# Patient Record
Sex: Female | Born: 1950 | Race: White | Hispanic: No | Marital: Married | Smoking: Never smoker
Health system: Southern US, Community
[De-identification: ages and names within clinical notes are randomized; demographics above are authoritative.]

## PROBLEM LIST (undated history)

## (undated) DIAGNOSIS — E559 Vitamin D deficiency, unspecified: Secondary | ICD-10-CM

## (undated) DIAGNOSIS — F332 Major depressive disorder, recurrent severe without psychotic features: Secondary | ICD-10-CM

## (undated) DIAGNOSIS — Z9884 Bariatric surgery status: Secondary | ICD-10-CM

## (undated) DIAGNOSIS — E538 Deficiency of other specified B group vitamins: Secondary | ICD-10-CM

## (undated) DIAGNOSIS — R5382 Chronic fatigue, unspecified: Secondary | ICD-10-CM

## (undated) DIAGNOSIS — G4700 Insomnia, unspecified: Secondary | ICD-10-CM

## (undated) DIAGNOSIS — E039 Hypothyroidism, unspecified: Secondary | ICD-10-CM

## (undated) DIAGNOSIS — F419 Anxiety disorder, unspecified: Secondary | ICD-10-CM

## (undated) DIAGNOSIS — R55 Syncope and collapse: Principal | ICD-10-CM

## (undated) DIAGNOSIS — F410 Panic disorder [episodic paroxysmal anxiety] without agoraphobia: Secondary | ICD-10-CM

## (undated) DIAGNOSIS — E669 Obesity, unspecified: Secondary | ICD-10-CM

## (undated) HISTORY — DX: Hypothyroidism, unspecified: E03.9

## (undated) HISTORY — DX: Syncope and collapse: R55

## (undated) HISTORY — PX: CHOLECYSTECTOMY: SHX55

## (undated) HISTORY — PX: BREAST SURGERY: SHX581

## (undated) HISTORY — PX: LAMINOTOMY / EXCISION DISK POSTERIOR CERVICAL SPINE: SUR749

## (undated) HISTORY — DX: Major depressive disorder, recurrent severe without psychotic features: F33.2

## (undated) HISTORY — DX: Deficiency of other specified B group vitamins: E53.8

## (undated) HISTORY — DX: Bariatric surgery status: Z98.84

## (undated) HISTORY — DX: Obesity, unspecified: E66.9

## (undated) HISTORY — DX: Insomnia, unspecified: G47.00

## (undated) HISTORY — DX: Vitamin D deficiency, unspecified: E55.9

## (undated) HISTORY — DX: Anxiety disorder, unspecified: F41.9

## (undated) HISTORY — DX: Chronic fatigue, unspecified: R53.82

## (undated) HISTORY — PX: TUBAL LIGATION: SHX77

## (undated) HISTORY — DX: Panic disorder (episodic paroxysmal anxiety): F41.0

---

## 2000-03-22 HISTORY — PX: GASTRIC BYPASS: SHX52

## 2011-07-14 DIAGNOSIS — F339 Major depressive disorder, recurrent, unspecified: Secondary | ICD-10-CM | POA: Diagnosis not present

## 2011-11-08 ENCOUNTER — Ambulatory Visit (INDEPENDENT_AMBULATORY_CARE_PROVIDER_SITE_OTHER): Payer: Medicare Other | Admitting: Family Medicine

## 2011-11-08 ENCOUNTER — Encounter: Payer: Self-pay | Admitting: Family Medicine

## 2011-11-08 VITALS — BP 110/68 | HR 72 | Temp 98.5°F | Ht 66.5 in | Wt 214.0 lb

## 2011-11-08 DIAGNOSIS — R55 Syncope and collapse: Secondary | ICD-10-CM

## 2011-11-08 DIAGNOSIS — I951 Orthostatic hypotension: Secondary | ICD-10-CM

## 2011-11-08 DIAGNOSIS — G4700 Insomnia, unspecified: Secondary | ICD-10-CM | POA: Diagnosis not present

## 2011-11-08 DIAGNOSIS — F332 Major depressive disorder, recurrent severe without psychotic features: Secondary | ICD-10-CM

## 2011-11-08 DIAGNOSIS — E538 Deficiency of other specified B group vitamins: Secondary | ICD-10-CM | POA: Diagnosis not present

## 2011-11-08 DIAGNOSIS — Z79899 Other long term (current) drug therapy: Secondary | ICD-10-CM | POA: Diagnosis not present

## 2011-11-08 DIAGNOSIS — R5381 Other malaise: Secondary | ICD-10-CM

## 2011-11-08 DIAGNOSIS — E559 Vitamin D deficiency, unspecified: Secondary | ICD-10-CM

## 2011-11-08 DIAGNOSIS — Z9884 Bariatric surgery status: Secondary | ICD-10-CM

## 2011-11-08 DIAGNOSIS — F410 Panic disorder [episodic paroxysmal anxiety] without agoraphobia: Secondary | ICD-10-CM | POA: Diagnosis not present

## 2011-11-08 DIAGNOSIS — E039 Hypothyroidism, unspecified: Secondary | ICD-10-CM | POA: Diagnosis not present

## 2011-11-08 DIAGNOSIS — R5383 Other fatigue: Secondary | ICD-10-CM

## 2011-11-08 DIAGNOSIS — G9332 Myalgic encephalomyelitis/chronic fatigue syndrome: Secondary | ICD-10-CM

## 2011-11-08 DIAGNOSIS — D649 Anemia, unspecified: Secondary | ICD-10-CM

## 2011-11-08 DIAGNOSIS — R5382 Chronic fatigue, unspecified: Secondary | ICD-10-CM

## 2011-11-08 LAB — CBC WITH DIFFERENTIAL/PLATELET
Basophils Relative: 0.9 % (ref 0.0–3.0)
HCT: 32.6 % — ABNORMAL LOW (ref 36.0–46.0)
Hemoglobin: 10.6 g/dL — ABNORMAL LOW (ref 12.0–15.0)
Lymphocytes Relative: 31.9 % (ref 12.0–46.0)
Lymphs Abs: 1.8 10*3/uL (ref 0.7–4.0)
Monocytes Relative: 5.4 % (ref 3.0–12.0)
Neutro Abs: 3.2 10*3/uL (ref 1.4–7.7)
RBC: 3.96 Mil/uL (ref 3.87–5.11)

## 2011-11-08 LAB — T3, FREE: T3, Free: 2.5 pg/mL (ref 2.3–4.2)

## 2011-11-08 LAB — TSH: TSH: 0.11 u[IU]/mL — ABNORMAL LOW (ref 0.35–5.50)

## 2011-11-08 LAB — HEPATIC FUNCTION PANEL
ALT: 14 U/L (ref 0–35)
AST: 18 U/L (ref 0–37)
Albumin: 3.9 g/dL (ref 3.5–5.2)
Alkaline Phosphatase: 78 U/L (ref 39–117)
Total Protein: 6.9 g/dL (ref 6.0–8.3)

## 2011-11-08 LAB — VITAMIN B12: Vitamin B-12: 236 pg/mL (ref 211–911)
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
## 2011-11-09 ENCOUNTER — Encounter: Payer: Self-pay | Admitting: Family Medicine

## 2011-11-09 DIAGNOSIS — E538 Deficiency of other specified B group vitamins: Secondary | ICD-10-CM | POA: Insufficient documentation

## 2011-11-09 DIAGNOSIS — F332 Major depressive disorder, recurrent severe without psychotic features: Secondary | ICD-10-CM

## 2011-11-09 DIAGNOSIS — E039 Hypothyroidism, unspecified: Secondary | ICD-10-CM

## 2011-11-09 DIAGNOSIS — Z9884 Bariatric surgery status: Secondary | ICD-10-CM

## 2011-11-09 DIAGNOSIS — G4700 Insomnia, unspecified: Secondary | ICD-10-CM

## 2011-11-09 DIAGNOSIS — G9332 Myalgic encephalomyelitis/chronic fatigue syndrome: Secondary | ICD-10-CM

## 2011-11-09 DIAGNOSIS — R5383 Other fatigue: Secondary | ICD-10-CM | POA: Insufficient documentation

## 2011-11-09 DIAGNOSIS — F5104 Psychophysiologic insomnia: Secondary | ICD-10-CM | POA: Insufficient documentation

## 2011-11-09 DIAGNOSIS — R5382 Chronic fatigue, unspecified: Secondary | ICD-10-CM

## 2011-11-09 DIAGNOSIS — F410 Panic disorder [episodic paroxysmal anxiety] without agoraphobia: Secondary | ICD-10-CM

## 2011-11-09 DIAGNOSIS — R55 Syncope and collapse: Secondary | ICD-10-CM

## 2011-11-09 DIAGNOSIS — E559 Vitamin D deficiency, unspecified: Secondary | ICD-10-CM | POA: Insufficient documentation

## 2011-11-09 HISTORY — DX: Panic disorder (episodic paroxysmal anxiety): F41.0

## 2011-11-09 HISTORY — DX: Vitamin D deficiency, unspecified: E55.9

## 2011-11-09 HISTORY — DX: Syncope and collapse: R55

## 2011-11-09 HISTORY — DX: Major depressive disorder, recurrent severe without psychotic features: F33.2

## 2011-11-09 HISTORY — DX: Hypothyroidism, unspecified: E03.9

## 2011-11-09 HISTORY — DX: Chronic fatigue, unspecified: R53.82

## 2011-11-09 HISTORY — DX: Bariatric surgery status: Z98.84

## 2011-11-09 HISTORY — DX: Myalgic encephalomyelitis/chronic fatigue syndrome: G93.32

## 2011-11-09 HISTORY — DX: Insomnia, unspecified: G47.00

## 2011-11-09 HISTORY — DX: Deficiency of other specified B group vitamins: E53.8

## 2011-11-09 LAB — VITAMIN D 25 HYDROXY (VIT D DEFICIENCY, FRACTURES): Vit D, 25-Hydroxy: 21 ng/mL — ABNORMAL LOW (ref 30–89)

## 2011-11-09 LAB — FERRITIN: Ferritin: 4.5 ng/mL — ABNORMAL LOW (ref 10.0–291.0)

## 2011-11-18 ENCOUNTER — Other Ambulatory Visit: Payer: Self-pay | Admitting: *Deleted

## 2011-11-18 MED ORDER — ERGOCALCIFEROL 1.25 MG (50000 UT) PO CAPS: 50000.0000 [IU] | ORAL_CAPSULE | ORAL | Status: DC

## 2011-11-18 MED ORDER — LEVOTHYROXINE SODIUM 137 MCG PO CAPS: ORAL_CAPSULE | ORAL | Status: DC

## 2011-11-18 NOTE — Telephone Encounter (Signed)
Advised patient of lab results, meds sent to pharmacy as instructed.

## 2011-11-23 ENCOUNTER — Other Ambulatory Visit: Payer: Self-pay

## 2011-11-23 MED ORDER — ERGOCALCIFEROL 1.25 MG (50000 UT) PO CAPS: 50000.0000 [IU] | ORAL_CAPSULE | ORAL | Status: DC

## 2011-11-23 MED ORDER — LEVOTHYROXINE SODIUM 137 MCG PO CAPS: ORAL_CAPSULE | ORAL | Status: DC

## 2011-11-23 NOTE — Telephone Encounter (Signed)
Pt went to pick up Vit D and Synthroid and med was not at [facility]; rx was printed rather than sent electronically on 11/18/11. Med sent to [facility];pt notified while on phone.

## 2012-01-10 ENCOUNTER — Other Ambulatory Visit: Payer: Self-pay | Admitting: Family Medicine

## 2012-01-10 NOTE — Telephone Encounter (Signed)
Refilled   Hannah Beat, MD 01/10/2012, 5:22 PM

## 2012-01-10 NOTE — Telephone Encounter (Signed)
Received refill request electronically.  Patient has an appointment scheduled 01/24/12.  Patient is due for blood work. Is it okay to refill medication?

## 2012-01-24 ENCOUNTER — Ambulatory Visit (INDEPENDENT_AMBULATORY_CARE_PROVIDER_SITE_OTHER): Payer: Medicare Other | Admitting: Family Medicine

## 2012-01-24 ENCOUNTER — Encounter: Payer: Self-pay | Admitting: Family Medicine

## 2012-01-24 VITALS — BP 96/62 | HR 66 | Temp 98.7°F | Wt 213.0 lb

## 2012-01-24 DIAGNOSIS — E785 Hyperlipidemia, unspecified: Secondary | ICD-10-CM

## 2012-01-24 DIAGNOSIS — E039 Hypothyroidism, unspecified: Secondary | ICD-10-CM

## 2012-01-24 DIAGNOSIS — D509 Iron deficiency anemia, unspecified: Secondary | ICD-10-CM | POA: Insufficient documentation

## 2012-01-24 DIAGNOSIS — Z13220 Encounter for screening for lipoid disorders: Secondary | ICD-10-CM | POA: Diagnosis not present

## 2012-01-24 LAB — LIPID PANEL
HDL: 62.1 mg/dL (ref >39.00)
LDL Cholesterol: 97 mg/dL (ref 0–99)
Total CHOL/HDL Ratio: 3
Triglycerides: 75 mg/dL (ref 0.0–149.0)

## 2012-01-24 LAB — CBC WITH DIFFERENTIAL/PLATELET
Basophils Absolute: 0 10*3/uL (ref 0.0–0.1)
HCT: 38.3 % (ref 36.0–46.0)
Lymphs Abs: 1.3 10*3/uL (ref 0.7–4.0)
Monocytes Relative: 5.7 % (ref 3.0–12.0)
Platelets: 275 10*3/uL (ref 150.0–400.0)
RDW: 16.4 % — ABNORMAL HIGH (ref 11.5–14.6)

## 2012-01-24 NOTE — Patient Instructions (Addendum)
F/u 6 months for a medicare CPX  [facility] in [employer] in [place]

## 2012-01-24 NOTE — Progress Notes (Signed)
Nature conservation officer at [facility] [address] [place] [address] Phone: [phone] Fax: [phone]  Date:  01/24/2012   Name:  BRITTNYE JOSEPHS   DOB:  Nov 10, 1950   MRN:  [id number] Gender: female Age: 61 y.o.  PCP:  Hannah Beat, MD  Evaluating MD: Hannah Beat, MD   Chief Complaint: Follow-up   History of Present Illness:  Jody White is a 61 y.o. pleasant patient who presents with the following:  Has been fasting, has not been feeling well since Saturday. Will feel very tired after has some lightheadedness. Overall, feeling much better.   CBC:    Component Value Date/Time   WBC 5.5 11/08/2011 1455   HGB 10.6* 11/08/2011 1455   HCT 32.6* 11/08/2011 1455   PLT 311.0 11/08/2011 1455   MCV 82.3 11/08/2011 1455   NEUTROABS 3.2 11/08/2011 1455   LYMPHSABS 1.8 11/08/2011 1455   MONOABS 0.3 11/08/2011 1455   EOSABS 0.2 11/08/2011 1455   BASOSABS 0.0 11/08/2011 1455    Iron/TIBC/Ferritin    Component Value Date/Time   IRON 37* 11/08/2011 1455   FERRITIN 4.5* 11/08/2011 1455   Iron def anemia, feeling better  Thyroid: No symptoms. Labs reviewed. Denies cold / heat intolerance, dry skin, hair loss. No goiter.  Lab Results  Component Value Date   TSH 0.11* 11/08/2011     Patient Active Problem List  Diagnosis  . Vasovagal syncope  . B12 deficiency  . Vitamin d deficiency  . Chronic fatigue syndrome  . H/O gastric bypass  . Hypothyroid  . Panic disorder without agoraphobia  . Persistent disorder of initiating or maintaining sleep  . Major depressive disorder, recurrent episode, severe, without mention of psychotic behavior  . Iron deficiency anemia    Past Medical History  Diagnosis Date  . 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 B12 deficiency 11/09/2011  . Vitamin D deficiency 11/09/2011  . Persistent disorder of initiating or maintaining sleep 11/09/2011  . Panic disorder  without agoraphobia 11/09/2011  . Hypothyroid 11/09/2011  . H/O gastric bypass 11/09/2011  . Chronic fatigue syndrome 11/09/2011  . Vasovagal syncope 11/09/2011    Multiple prior admissions, [facility] [place] in [place]. Confirmed with Tilt Table testing in [place]     Past Surgical History  Procedure Date  . Gastric bypass 2002    History  Substance Use Topics  . Smoking status: Never Smoker   . Smokeless tobacco: Never Used  . Alcohol Use: Yes     Comment: wine occassionally    Family History  Problem Relation Age of Onset  . Alcohol abuse Father     No Known Allergies  Medication list has been reviewed and updated.  Outpatient Prescriptions Prior to Visit  Medication Sig Dispense Refill  . ALPRAZolam (XANAX) 0.5 MG tablet Take 0.5 mg by mouth 3 (three) times daily as needed.      . dicyclomine (BENTYL) 20 MG tablet Take 1 -2 by mouth daily as needed      . escitalopram (LEXAPRO) 20 MG tablet Take 20 mg by mouth daily.      . Levothyroxine Sodium 137 MCG CAPS Take one by mouth daily  30 capsule  2  . Vitamin D, Ergocalciferol, (DRISDOL) 50000 UNITS CAPS TAKE 1 CAPSULE (50,000 UNITS TOTAL) BY MOUTH ONCE A WEEK.  4 capsule  3  . zolpidem (AMBIEN CR) 12.5 MG CR tablet Take 12.5 mg by mouth at bedtime.  Last reviewed on 01/24/2012  8:06 AM by Loma Messing, RN  Review of Systems:  Some lightheadedness over weekend, no cp, no sob  Physical Examination: Filed Vitals:   01/24/12 0801  BP: 96/62  Pulse: 66  Temp: 98.7 F (37.1 C)  TempSrc: Oral  Weight: 213 lb (96.616 kg)  SpO2: 96%    There is no height on file to calculate BMI. Ideal Body Weight:     GEN: WDWN, NAD, Non-toxic, A & O x 3 HEENT: Atraumatic, Normocephalic. Neck supple. No masses, No LAD. Ears and Nose: No external deformity. CV: RRR, No M/G/R. No JVD. No thrill. No extra heart sounds. PULM: CTA B, no wheezes, crackles, rhonchi. No retractions. No resp. distress. No accessory  muscle use. EXTR: No c/c/e NEURO Normal gait.  PSYCH: Normally interactive. Conversant. Not depressed or anxious appearing.  Calm demeanor.    Assessment and Plan:  1. Iron deficiency anemia  CBC with Differential  2. Hypothyroid  TSH  3. Screening for lipoid disorders  Lipid panel  4. Other and unspecified hyperlipidemia  Lipid panel   Recheck levels today, feeling better  Orders Today:  Orders Placed This Encounter  Procedures  . TSH  . Lipid panel  . CBC with Differential    Updated Medication List: (Includes new medications, updates to list, dose adjustments) Meds ordered this encounter  Medications  . ferrous sulfate 325 (65 FE) MG tablet    Sig: Take 325 mg by mouth 2 (two) times daily with a meal.    Medications Discontinued: There are no discontinued medications.   Hannah Beat, MD

## 2012-01-26 MED ORDER — LEVOTHYROXINE SODIUM 125 MCG PO TABS: 125.0000 ug | ORAL_TABLET | Freq: Every day | ORAL | Status: DC

## 2012-01-26 NOTE — Addendum Note (Signed)
Addended by: Hannah Beat on: 01/26/2012 01:52 PM   Modules accepted: Orders

## 2012-02-25 ENCOUNTER — Other Ambulatory Visit: Payer: Self-pay

## 2012-02-25 NOTE — Telephone Encounter (Signed)
pts husband left note requesting refill on bentyl to [facility].Please advise.

## 2012-02-26 MED ORDER — DICYCLOMINE HCL 20 MG PO TABS: ORAL_TABLET | ORAL | Status: DC

## 2012-02-26 NOTE — Telephone Encounter (Signed)
done

## 2012-07-19 ENCOUNTER — Ambulatory Visit (INDEPENDENT_AMBULATORY_CARE_PROVIDER_SITE_OTHER): Payer: BC Managed Care – PPO | Admitting: Family Medicine

## 2012-07-19 ENCOUNTER — Encounter: Payer: Self-pay | Admitting: Family Medicine

## 2012-07-19 VITALS — BP 120/64 | HR 56 | Temp 98.1°F | Ht 66.5 in | Wt 214.5 lb

## 2012-07-19 DIAGNOSIS — E559 Vitamin D deficiency, unspecified: Secondary | ICD-10-CM | POA: Diagnosis not present

## 2012-07-19 DIAGNOSIS — E538 Deficiency of other specified B group vitamins: Secondary | ICD-10-CM

## 2012-07-19 DIAGNOSIS — R5381 Other malaise: Secondary | ICD-10-CM

## 2012-07-19 DIAGNOSIS — Z0000 Encounter for general adult medical examination without abnormal findings: Secondary | ICD-10-CM

## 2012-07-19 DIAGNOSIS — R5383 Other fatigue: Secondary | ICD-10-CM

## 2012-07-19 DIAGNOSIS — E039 Hypothyroidism, unspecified: Secondary | ICD-10-CM

## 2012-07-19 DIAGNOSIS — D509 Iron deficiency anemia, unspecified: Secondary | ICD-10-CM | POA: Diagnosis not present

## 2012-07-19 LAB — TSH: TSH: 1.91 u[IU]/mL (ref 0.35–5.50)

## 2012-07-19 LAB — CBC WITH DIFFERENTIAL/PLATELET
Basophils Absolute: 0 10*3/uL (ref 0.0–0.1)
Hemoglobin: 12.7 g/dL (ref 12.0–15.0)
Lymphocytes Relative: 37.4 % (ref 12.0–46.0)
Monocytes Relative: 6 % (ref 3.0–12.0)
Neutro Abs: 2.1 10*3/uL (ref 1.4–7.7)
Neutrophils Relative %: 53.3 % (ref 43.0–77.0)
Platelets: 256 10*3/uL (ref 150.0–400.0)
RDW: 13.3 % (ref 11.5–14.6)
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
## 2012-07-20 LAB — VITAMIN D 25 HYDROXY (VIT D DEFICIENCY, FRACTURES): Vit D, 25-Hydroxy: 20 ng/mL — ABNORMAL LOW (ref 30–89)

## 2012-07-21 ENCOUNTER — Telehealth: Payer: Self-pay | Admitting: *Deleted

## 2012-07-21 MED ORDER — VITAMIN D (ERGOCALCIFEROL) 1.25 MG (50000 UNIT) PO CAPS: ORAL_CAPSULE | ORAL | Status: DC

## 2012-07-21 NOTE — Telephone Encounter (Signed)
Medication sent to pharamcy

## 2012-07-24 ENCOUNTER — Other Ambulatory Visit: Payer: Self-pay | Admitting: Family Medicine

## 2012-07-24 NOTE — Telephone Encounter (Signed)
Pt request status of name brand synthroid 125 mcg to [facility]; [facility] to Durenda Age that was printed instead of going electronically.pt notified refill given to [facility]. [facility] for [facility].

## 2012-11-22 ENCOUNTER — Other Ambulatory Visit: Payer: Self-pay | Admitting: Family Medicine

## 2012-11-22 MED ORDER — LEVOTHYROXINE SODIUM 125 MCG PO TABS: ORAL_TABLET | ORAL | Status: DC

## 2013-01-20 ENCOUNTER — Other Ambulatory Visit: Payer: Self-pay | Admitting: Family Medicine

## 2013-01-21 NOTE — Telephone Encounter (Signed)
Last office visit 07/19/2012. Do you want to refill Vit D?

## 2013-07-20 ENCOUNTER — Other Ambulatory Visit: Payer: Self-pay | Admitting: Family Medicine

## 2013-08-12 ENCOUNTER — Other Ambulatory Visit: Payer: Self-pay | Admitting: Family Medicine

## 2013-08-14 NOTE — Telephone Encounter (Signed)
Last office visit 07/19/2012.  Last TSH 07/19/2012.  Ok to refill?

## 2013-08-15 NOTE — Telephone Encounter (Signed)
Ok to refill 30, 3 refills  F/u CPX in the next few months

## 2013-09-03 ENCOUNTER — Encounter: Payer: Self-pay | Admitting: Family Medicine

## 2013-09-03 ENCOUNTER — Ambulatory Visit (INDEPENDENT_AMBULATORY_CARE_PROVIDER_SITE_OTHER): Payer: Medicare Other | Admitting: Family Medicine

## 2013-09-03 VITALS — BP 110/74 | HR 68 | Temp 98.1°F | Resp 18 | Wt 216.0 lb

## 2013-09-03 DIAGNOSIS — E039 Hypothyroidism, unspecified: Secondary | ICD-10-CM

## 2013-09-03 DIAGNOSIS — G9332 Myalgic encephalomyelitis/chronic fatigue syndrome: Secondary | ICD-10-CM | POA: Diagnosis not present

## 2013-09-03 DIAGNOSIS — R5381 Other malaise: Secondary | ICD-10-CM

## 2013-09-03 DIAGNOSIS — Z79899 Other long term (current) drug therapy: Secondary | ICD-10-CM

## 2013-09-03 DIAGNOSIS — D509 Iron deficiency anemia, unspecified: Secondary | ICD-10-CM | POA: Diagnosis not present

## 2013-09-03 DIAGNOSIS — F332 Major depressive disorder, recurrent severe without psychotic features: Secondary | ICD-10-CM | POA: Diagnosis not present

## 2013-09-03 DIAGNOSIS — F410 Panic disorder [episodic paroxysmal anxiety] without agoraphobia: Secondary | ICD-10-CM | POA: Diagnosis not present

## 2013-09-03 DIAGNOSIS — R5383 Other fatigue: Secondary | ICD-10-CM | POA: Diagnosis not present

## 2013-09-03 DIAGNOSIS — R5382 Chronic fatigue, unspecified: Secondary | ICD-10-CM | POA: Diagnosis not present

## 2013-09-03 DIAGNOSIS — R55 Syncope and collapse: Secondary | ICD-10-CM

## 2013-09-03 LAB — COMPREHENSIVE METABOLIC PANEL
ALK PHOS: 77 U/L (ref 39–117)
ALT: 22 U/L (ref 0–35)
AST: 23 U/L (ref 0–37)
Albumin: 4.2 g/dL (ref 3.5–5.2)
BILIRUBIN TOTAL: 1.1 mg/dL (ref 0.2–1.2)
BUN: 13 mg/dL (ref 6–23)
CO2: 30 meq/L (ref 19–32)
Calcium: 9.7 mg/dL (ref 8.4–10.5)
Chloride: 104 mEq/L (ref 96–112)
Creatinine, Ser: 0.7 mg/dL (ref 0.4–1.2)
GFR: 87.81 mL/min (ref >60.00)
GLUCOSE: 112 mg/dL — AB (ref 70–99)
Potassium: 4.6 mEq/L (ref 3.5–5.1)
SODIUM: 140 meq/L (ref 135–145)
TOTAL PROTEIN: 6.8 g/dL (ref 6.0–8.3)

## 2013-09-03 LAB — CBC WITH DIFFERENTIAL/PLATELET
BASOS PCT: 0.6 % (ref 0.0–3.0)
Basophils Absolute: 0 10*3/uL (ref 0.0–0.1)
EOS PCT: 1.9 % (ref 0.0–5.0)
Eosinophils Absolute: 0.1 10*3/uL (ref 0.0–0.7)
HEMATOCRIT: 38.2 % (ref 36.0–46.0)
Hemoglobin: 12.8 g/dL (ref 12.0–15.0)
LYMPHS ABS: 1.7 10*3/uL (ref 0.7–4.0)
Lymphocytes Relative: 34.1 % (ref 12.0–46.0)
MCHC: 33.5 g/dL (ref 30.0–36.0)
MCV: 87.9 fl (ref 78.0–100.0)
MONO ABS: 0.3 10*3/uL (ref 0.1–1.0)
MONOS PCT: 5.2 % (ref 3.0–12.0)
NEUTROS PCT: 58.2 % (ref 43.0–77.0)
Neutro Abs: 2.9 10*3/uL (ref 1.4–7.7)
PLATELETS: 321 10*3/uL (ref 150.0–400.0)
RBC: 4.35 Mil/uL (ref 3.87–5.11)
RDW: 13.4 % (ref 11.5–15.5)
WBC: 5 10*3/uL (ref 4.0–10.5)

## 2013-09-03 LAB — TSH: TSH: 1.73 u[IU]/mL (ref 0.35–4.50)

## 2013-09-03 LAB — IBC PANEL
IRON: 70 ug/dL (ref 42–145)
SATURATION RATIOS: 15.6 % — AB (ref 20.0–50.0)
Transferrin: 320.9 mg/dL (ref 212.0–360.0)

## 2013-09-03 NOTE — Progress Notes (Signed)
[address] [place] [address] Phone: [phone] Fax: [phone]  Patient ID: Jody OtaYvelisse M Brenes MRN: [id number], DOB: 1950/07/02, 63 y.o. Date of Encounter: 09/03/2013  Primary Physician:  Hannah BeatSpencer Liyana Suniga, MD   Chief Complaint: No chief complaint on file.   Subjective:   History of Present Illness:  Jody White is a 63 y.o. very pleasant female patient who presents with the following:  She is here followup for essentially laboratories, and she also has mother healthcare questions in general.  Followup hypothyroidism, currently on 125 mcg and doing well.  Anxiety and depression, the patient stopped her Lexapro, she is still seeing her psychiatrist, and she is currently now only taking Xanax as needed.  She also takes some Ambien CR as needed at bedtime.  She also does have a history of anemia in the past, and wanted to have her blood count and iron levels checked.  She is also status post gastric bypass and she has chronic fatigue syndrome.  She has had a tubal ligation, and I discussed potential health maintenance including Pap smears, mammograms, colonoscopies, and she declined. She stated that "she believes and the Resurrection, and does not fear death."  Past Medical History, Surgical History, Social History, Family History, Problem List, Medications, and Allergies have been reviewed and updated if relevant.  Review of Systems:  GEN: No acute illnesses, no fevers, chills. GI: No n/v/d, eating normally Pulm: No SOB Interactive and getting along well at home.  Otherwise, ROS is as per the HPI.  Objective:   Physical Examination: Filed Vitals:   09/03/13 1434  BP: 110/74  Pulse: 68  Temp: 98.1 F (36.7 C)  Resp: 18  Weight: 216 lb (97.977 kg)    GEN: WDWN, NAD, Non-toxic, A & O x 3 HEENT: Atraumatic, Normocephalic. Neck supple. No masses, No LAD. Ears and Nose: No external deformity. CV: RRR, No M/G/R. No JVD. No thrill. No extra heart  sounds. PULM: CTA B, no wheezes, crackles, rhonchi. No retractions. No resp. distress. No accessory muscle use. EXTR: No c/c/e NEURO Normal gait.  PSYCH: Normally interactive. Conversant. Not depressed or anxious appearing.  Calm demeanor.   Laboratory and Imaging Data:  Assessment & Plan:   Unspecified hypothyroidism - Plan: Comprehensive metabolic panel, CBC with Differential, TSH, IBC panel  Other malaise and fatigue - Plan: Comprehensive metabolic panel, CBC with Differential, TSH, IBC panel  Encounter for long-term (current) use of other medications - Plan: Comprehensive metabolic panel, CBC with Differential, TSH, IBC panel  Chronic fatigue syndrome  Hypothyroid  Major depressive disorder, recurrent episode, severe, without mention of psychotic behavior  Iron deficiency anemia  Panic disorder without agoraphobia  Vasovagal syncope  Check basic laboratories. Hopefully she will do okay off of all of her psychiatric medication.  Will refill and alter anything as needed based on her laboratories, and refill her thyroid medication based on her TSH  New Prescriptions   No medications on file   Modified Medications   No medications on file   Orders Placed This Encounter  Procedures  . Comprehensive metabolic panel  . CBC with Differential  . TSH  . IBC panel   Follow-up: No Follow-up on file. Unless noted above, the patient is to follow-up if symptoms worsen. Red flags were reviewed with the patient.  Signed,  Elpidio GaleaSpencer T. Maudry Zeidan, MD, CAQ Sports Medicine   Discontinued Medications   DICYCLOMINE (BENTYL) 20 MG TABLET    TAKE 1 -2 BY MOUTH DAILY AS NEEDED  ESCITALOPRAM (LEXAPRO) 20 MG TABLET    Take 20 mg by mouth daily.   Current Medications at Discharge:   Medication List       This list is accurate as of: 09/03/13  2:39 PM.  Always use your most recent med list.               ALPRAZolam 0.5 MG tablet  Commonly known as:  XANAX  Take 0.5 mg by mouth  3 (three) times daily as needed.     ferrous sulfate 325 (65 FE) MG tablet  Take 325 mg by mouth 2 (two) times daily with a meal.     SYNTHROID 125 MCG tablet  Generic drug:  levothyroxine  TAKE ONE TABLET BY MOUTH DAILY     Vitamin D (Ergocalciferol) 50000 UNITS Caps capsule  Commonly known as:  DRISDOL  TAKE 1 CAPSULE (50,000 UNITS TOTAL) BY MOUTH ONCE A WEEK.     zolpidem 12.5 MG CR tablet  Commonly known as:  AMBIEN CR  Take 12.5 mg by mouth at bedtime.

## 2013-09-06 ENCOUNTER — Encounter: Payer: Self-pay | Admitting: *Deleted

## 2013-09-06 MED ORDER — SYNTHROID 125 MCG PO TABS: ORAL_TABLET | ORAL | Status: DC

## 2013-09-06 NOTE — Addendum Note (Signed)
Addended by: Hannah BeatOPLAND, SPENCER on: 09/06/2013 10:32 AM   Modules accepted: Orders

## 2013-09-27 ENCOUNTER — Telehealth: Payer: Self-pay | Admitting: Family Medicine

## 2013-09-27 NOTE — Telephone Encounter (Signed)
Patient Information:  Caller Name: Jody White  Phone: [phone]  Patient: Jody White, Jody White  Gender: Female  DOB: 1950-04-30  Age: 63 Years  PCP: Hannah Beatopland, Spencer (Family Practice)  Office Follow Up:  Does the office need to follow up with this patient?: No  Instructions For The Office: N/A   Symptoms  Reason For Call & Symptoms: Pt reports having left eye redness and puffiness.  Reviewed Health History In EMR: Yes  Reviewed Medications In EMR: Yes  Reviewed Allergies In EMR: Yes  Reviewed Surgeries / Procedures: Yes  Date of Onset of Symptoms: 09/26/2013  Guideline(s) Used:  Eye - Red Without Pus  Disposition Per Guideline:   Home Care  Reason For Disposition Reached:   Red eye and no complications  Advice Given:  N/A  Patient Will Follow Care Advice:  YES

## 2014-05-31 ENCOUNTER — Telehealth: Payer: Self-pay | Admitting: Family Medicine

## 2014-05-31 MED ORDER — SYNTHROID 125 MCG PO TABS: ORAL_TABLET | ORAL | Status: DC

## 2014-05-31 NOTE — Telephone Encounter (Signed)
Okay with me, set up  CPX  After 07/20/2014 with fasting labs prior

## 2014-05-31 NOTE — Telephone Encounter (Signed)
Jody White came in office with her spouse today. She would like switch from Dr. Patsy Lageropland to Dr. Ermalene SearingBedsole b/c she is more comfortable speaking to a female about female issues. Will this switch be ok?  Jody White also needs refill on synthroid. [facility] [place]. Please advise

## 2014-05-31 NOTE — Telephone Encounter (Signed)
I am Ok with that. She is very nice. I think that her daughter is Dr. Senaida LangeB's patient. Whole family is nice.   Lupita LeashDonna, can you help refill 3 month supply of synthroid.

## 2014-06-03 NOTE — Telephone Encounter (Signed)
LVM for pt to c/b and schedule cpe after 07/20/14 with Dr. Ermalene SearingBedsole.  PCP changed

## 2014-09-02 ENCOUNTER — Encounter: Payer: Self-pay | Admitting: *Deleted

## 2014-09-18 ENCOUNTER — Telehealth: Payer: Self-pay | Admitting: Family Medicine

## 2014-09-18 NOTE — Telephone Encounter (Signed)
PLEASE NOTE: All timestamps contained within this report are represented as [place] Standard Time. CONFIDENTIALTY NOTICE: This fax transmission is intended only for the addressee. It contains information that is legally privileged, confidential or otherwise protected from use or disclosure. If you are not the intended recipient, you are strictly prohibited from reviewing, disclosing, copying using or disseminating any of this information or taking any action in reliance on or regarding this information. If you have received this fax in error, please notify us immediately by telephone so that we can arrange for its return to [place]. Phone: [phone], Toll-Free: [phone], Fax: [phone] Page: 1 of 2 Call Id: [id number] [facility] Day - Client TELEPHONE ADVICE RECORD [facility] Patient Name: Jody White Gender: Female DOB: 24-Jan-1951 Age: 64 Y 9 D Return Phone Number: [phone] (Primary), [phone] (Secondary) Address[place] [address] Client [facility] Day - Client Client Site [facility] - Day Physician Copland, Karleen Hampshire Contact Type Call Call Type Triage / Clinical Relationship To Patient Self Appointment Disposition EMR Appointment Not Necessary Info pasted into Epic Yes Return Phone Number [phone] (Primary) Chief Complaint Cold Symptom Initial Comment Caller states, she has a cold, wants to see a Dr PreDisposition Call Doctor Nurse Assessment Nurse: Yetta Barre, RN, Miranda Date/Time (Eastern Time): 09/18/2014 9:23:51 AM Confirm and document reason for call. If symptomatic, describe symptoms. ---Caller states she also has nasal congestion and sore throat for the last 2 days. Temp up to 100.6 yesterday afternoon. Has the patient traveled out of the country within the last 30 days? ---No Does the patient require triage? ---Yes Related visit to physician within the  last 2 weeks? ---No Does the PT have any chronic conditions? (i.e. diabetes, asthma, etc.) ---Yes List chronic conditions. ---Thyroid Guidelines Guideline Title Affirmed Question Affirmed Notes Nurse Date/Time Lamount Cohen Time) Common Cold Cold with no complications (all triage questions negative) Yetta Barre, RN, Miranda 09/18/2014 9:26:41 AM Disp. Time Lamount Cohen Time) Disposition Final User 09/18/2014 9:32:29 AM Home Care Yes Yetta Barre, RN, Lenetta Quaker Understands: Yes Disagree/Comply: Comply Care Advice Given Per Guideline PLEASE NOTE: All timestamps contained within this report are represented as [place] Standard Time. CONFIDENTIALTY NOTICE: This fax transmission is intended only for the addressee. It contains information that is legally privileged, confidential or otherwise protected from use or disclosure. If you are not the intended recipient, you are strictly prohibited from reviewing, disclosing, copying using or disseminating any of this information or taking any action in reliance on or regarding this information. If you have received this fax in error, please notify us immediately by telephone so that we can arrange for its return to [place]. Phone: [phone], Toll-Free: [phone], Fax: [phone] Page: 2 of 2 Call Id: [id number] Care Advice Given Per Guideline REASSURANCE: HOME CARE: You should be able to treat this at home. * It sounds like an uncomplicated cold that we can treat at home. FOR A STUFFY NOSE - USE NASAL WASHES: * Introduction: Saline (salt water) nasal irrigation (nasal wash) is an effective and simple home remedy for treating stuffy nose and sinus congestion. The nose can be irrigated by pouring, spraying, or squirting salt water into the nose and then letting it run back out. * How it Helps: The salt water rinses out excess mucus, washes out any irritants (dust, allergens) that might be present, and moistens the nasal cavity. * Methods: There are several ways to  perform nasal irrigation. You can use a saline nasal spray  bottle (available over-the-counter), a rubber ear syringe, a medical syringe without the needle, or a NETI POT. MEDICINES FOR STUFFY OR RUNNY NOSE: * Most cold medicines that are available over-thecounter (OTC) are not helpful. NASAL DECONGESTANTS FOR A VERY STUFFY NOSE: * If you have a very stuffy nose, nasal decongestant medicines can shrink the swollen nasal mucosa and allow for easier breathing. If you have a very runny nose, these medicines can reduce the amount of drainage. They may be taken as pills by mouth or as a nasal spray. * Most people do NOT need to use these medicines. If your nose feels blocked, you should try using nasal washes first. TREATMENT FOR ASSOCIATED SYMPTOMS OF COLDS: * For muscle aches, headaches, or moderate fever (over 101 degrees F) (38.9 C) use acetaminophen every 4 hours. * Sore throat: throat lozenges, hard candy or warm chicken broth. * Hydrate: drink extra liquids. HUMIDIFIER: If the air in your home is dry, use a humidifier. CONTAGIOUSNESS: * The cold virus is present in your nasal secretions. * Cover your nose and mouth with a tissue when you sneeze or cough. * Wash your hands frequently with soap and water. * You can return to work or school after the fever is gone and you feel well enough to participate in normal activities. EXPECTED COURSE: * Nasal discharge 7-14 days * Fever 2-3 days * Cough 2-3 weeks. CALL BACK IF: * Fever lasts over 3 days * Runny nose lasts over 10 days * You become short of breath * You become worse. CARE ADVICE given per Colds (Adult) guideline. * Colds are caused by viruses, and no medicine or 'shot' will cure an uncomplicated cold. After Care Instructions Given Call Event Type User Date / Time Description

## 2014-09-18 NOTE — Telephone Encounter (Signed)
Patient Name: Jody White DOB: [birth year] Initial Comment Caller states, she has a cold, wants to see a Dr Nurse Assessment Nurse: Yetta BarreJones, RN, Miranda Date/Time (Eastern Time): 09/18/2014 9:23:51 AM Confirm and document reason for call. If symptomatic, describe symptoms. ---Caller states she also has nasal congestion and sore throat for the last 2 days. Temp up to 100.6 yesterday afternoon. Has the patient traveled out of the country within the last 30 days? ---No Does the patient require triage? ---Yes Related visit to physician within the last 2 weeks? ---No Does the PT have any chronic conditions? (i.e. diabetes, asthma, etc.) ---Yes List chronic conditions. ---Thyroid Guidelines Guideline Title Affirmed Question Affirmed Notes Common Cold Cold with no complications (all triage questions negative) Final Disposition User Home Care Yetta BarreJones, RN, Tamera PuntMiranda

## 2014-09-19 NOTE — Telephone Encounter (Signed)
Noted  

## 2014-09-24 ENCOUNTER — Other Ambulatory Visit: Payer: Self-pay | Admitting: Family Medicine

## 2014-09-24 NOTE — Telephone Encounter (Signed)
Please call and schedule CPE with fasting lab with Dr. Ermalene SearingBedsole.

## 2014-09-25 ENCOUNTER — Encounter: Payer: Self-pay | Admitting: Family Medicine

## 2014-09-25 NOTE — Telephone Encounter (Signed)
Called pt, left message for pt to call to schedule a CPE / lt

## 2014-09-25 NOTE — Telephone Encounter (Signed)
Mailed pt a letter, the answering machine at home is in Spanish only and wanted to be sure pt got the message that a CPE with fasting labs is needed. / lt

## 2014-10-20 ENCOUNTER — Other Ambulatory Visit: Payer: Self-pay | Admitting: Family Medicine

## 2014-10-31 ENCOUNTER — Encounter: Payer: Self-pay | Admitting: *Deleted

## 2014-10-31 ENCOUNTER — Ambulatory Visit (INDEPENDENT_AMBULATORY_CARE_PROVIDER_SITE_OTHER): Payer: Medicare Other | Admitting: Obstetrics & Gynecology

## 2014-10-31 ENCOUNTER — Encounter: Payer: Self-pay | Admitting: Family Medicine

## 2014-10-31 ENCOUNTER — Ambulatory Visit (INDEPENDENT_AMBULATORY_CARE_PROVIDER_SITE_OTHER): Payer: Medicare Other | Admitting: Family Medicine

## 2014-10-31 ENCOUNTER — Encounter: Payer: Self-pay | Admitting: Obstetrics & Gynecology

## 2014-10-31 VITALS — BP 98/60 | HR 60 | Temp 98.6°F | Wt 211.0 lb

## 2014-10-31 VITALS — BP 95/50 | HR 79 | Resp 18 | Ht 66.5 in | Wt 211.0 lb

## 2014-10-31 DIAGNOSIS — E538 Deficiency of other specified B group vitamins: Secondary | ICD-10-CM | POA: Diagnosis not present

## 2014-10-31 DIAGNOSIS — E038 Other specified hypothyroidism: Secondary | ICD-10-CM

## 2014-10-31 DIAGNOSIS — D509 Iron deficiency anemia, unspecified: Secondary | ICD-10-CM

## 2014-10-31 DIAGNOSIS — F410 Panic disorder [episodic paroxysmal anxiety] without agoraphobia: Secondary | ICD-10-CM

## 2014-10-31 DIAGNOSIS — Z01419 Encounter for gynecological examination (general) (routine) without abnormal findings: Secondary | ICD-10-CM | POA: Diagnosis not present

## 2014-10-31 DIAGNOSIS — E559 Vitamin D deficiency, unspecified: Secondary | ICD-10-CM | POA: Diagnosis not present

## 2014-10-31 DIAGNOSIS — Z0000 Encounter for general adult medical examination without abnormal findings: Secondary | ICD-10-CM

## 2014-10-31 DIAGNOSIS — B353 Tinea pedis: Secondary | ICD-10-CM | POA: Diagnosis not present

## 2014-10-31 DIAGNOSIS — Z13220 Encounter for screening for lipoid disorders: Secondary | ICD-10-CM | POA: Diagnosis not present

## 2014-10-31 DIAGNOSIS — Z79899 Other long term (current) drug therapy: Secondary | ICD-10-CM | POA: Diagnosis not present

## 2014-10-31 LAB — LIPID PANEL
CHOL/HDL RATIO: 3
Cholesterol: 197 mg/dL (ref 0–200)
HDL: 75.8 mg/dL (ref >39.00)
LDL Cholesterol: 110 mg/dL — ABNORMAL HIGH (ref 0–99)
NONHDL: 121.1
Triglycerides: 58 mg/dL (ref 0.0–149.0)
VLDL: 11.6 mg/dL (ref 0.0–40.0)

## 2014-10-31 LAB — CBC WITH DIFFERENTIAL/PLATELET
BASOS PCT: 0.7 % (ref 0.0–3.0)
Basophils Absolute: 0 10*3/uL (ref 0.0–0.1)
EOS PCT: 1.5 % (ref 0.0–5.0)
Eosinophils Absolute: 0.1 10*3/uL (ref 0.0–0.7)
HCT: 40.1 % (ref 36.0–46.0)
Hemoglobin: 13.3 g/dL (ref 12.0–15.0)
LYMPHS PCT: 35.4 % (ref 12.0–46.0)
Lymphs Abs: 1.7 10*3/uL (ref 0.7–4.0)
MCHC: 33.2 g/dL (ref 30.0–36.0)
MCV: 88.5 fl (ref 78.0–100.0)
MONOS PCT: 6.1 % (ref 3.0–12.0)
Monocytes Absolute: 0.3 10*3/uL (ref 0.1–1.0)
NEUTROS PCT: 56.3 % (ref 43.0–77.0)
Neutro Abs: 2.8 10*3/uL (ref 1.4–7.7)
PLATELETS: 319 10*3/uL (ref 150.0–400.0)
RBC: 4.53 Mil/uL (ref 3.87–5.11)
RDW: 13.3 % (ref 11.5–15.5)
WBC: 4.9 10*3/uL (ref 4.0–10.5)

## 2014-10-31 LAB — COMPREHENSIVE METABOLIC PANEL
ALT: 16 U/L (ref 0–35)
AST: 20 U/L (ref 0–37)
Albumin: 4.3 g/dL (ref 3.5–5.2)
Alkaline Phosphatase: 69 U/L (ref 39–117)
BILIRUBIN TOTAL: 1.1 mg/dL (ref 0.2–1.2)
BUN: 15 mg/dL (ref 6–23)
CO2: 31 meq/L (ref 19–32)
CREATININE: 0.74 mg/dL (ref 0.40–1.20)
Calcium: 10 mg/dL (ref 8.4–10.5)
Chloride: 105 mEq/L (ref 96–112)
GFR: 83.42 mL/min (ref >60.00)
Glucose, Bld: 113 mg/dL — ABNORMAL HIGH (ref 70–99)
Potassium: 4.8 mEq/L (ref 3.5–5.1)
SODIUM: 144 meq/L (ref 135–145)
TOTAL PROTEIN: 7.4 g/dL (ref 6.0–8.3)

## 2014-10-31 LAB — VITAMIN B12: Vitamin B-12: 261 pg/mL (ref 211–911)

## 2014-10-31 LAB — POCT SKIN KOH: Skin KOH, POC: POSITIVE

## 2014-10-31 LAB — TSH: TSH: 9.01 u[IU]/mL — AB (ref 0.35–4.50)

## 2014-10-31 LAB — VITAMIN D 25 HYDROXY (VIT D DEFICIENCY, FRACTURES): VITD: 10.41 ng/mL — ABNORMAL LOW (ref 30.00–100.00)

## 2014-10-31 MED ORDER — KETOCONAZOLE 2 % EX CREA
1.0000 | TOPICAL_CREAM | Freq: Every day | CUTANEOUS | Status: DC
Start: 2014-10-31 — End: 2014-11-14

## 2014-10-31 NOTE — Progress Notes (Signed)
Pre visit review using our clinic review tool, if applicable. No additional management support is needed unless otherwise documented below in the visit note. 

## 2014-10-31 NOTE — Assessment & Plan Note (Signed)
KOH positive.  Treat with topical. Call if not improving as expected.

## 2014-10-31 NOTE — Progress Notes (Signed)
Subjective:    Jody White is a 64 y.o. MH P3 (43, 74, and 53 yo kids, 4 grands)  female who presents for an annual exam. She moved here 3 years ago to be with her kids/grands. The patient has no complaints today. The patient is not currently sexually active for about 6 years due to her husband's ED. GYN screening history: last pap: was normal. The patient wears seatbelts: yes. The patient participates in regular exercise: no. Has the patient ever been transfused or tattooed?: no. The patient reports that there is not domestic violence in her life.   Menstrual History: OB History    No data available      Menarche age: 45  No LMP recorded. Patient is postmenopausal.    The following portions of the patient's history were reviewed and updated as appropriate: allergies, current medications, past family history, past medical history, past social history, past surgical history and problem list.  Review of Systems A comprehensive review of systems was negative.  Mammogram due. Colonoscopy in 2010 in [place]. Married for 45 years.   Objective:    BP 95/50 mmHg  Pulse 79  Resp 18  Ht 5' 6.5" (1.689 m)  Wt 211 lb (95.709 kg)  BMI 33.55 kg/m2  General Appearance:    Alert, cooperative, no distress, appears stated age  Head:    Normocephalic, without obvious abnormality, atraumatic  Eyes:    PERRL, conjunctiva/corneas clear, EOM's intact, fundi    benign, both eyes  Ears:    Normal TM's and external ear canals, both ears  Nose:   Nares normal, septum midline, mucosa normal, no drainage    or sinus tenderness  Throat:   Lips, mucosa, and tongue normal; teeth and gums normal  Neck:   Supple, symmetrical, trachea midline, no adenopathy;    thyroid:  no enlargement/tenderness/nodules; no carotid   bruit or JVD  Back:     Symmetric, no curvature, ROM normal, no CVA tenderness  Lungs:     Clear to auscultation bilaterally, respirations unlabored  Chest Wall:    No tenderness or deformity   Heart:    Regular rate and rhythm, S1 and S2 normal, no murmur, rub   or gallop  Breast Exam:    No tenderness, masses, or nipple abnormality  Abdomen:     Soft, non-tender, bowel sounds active all four quadrants,    no masses, no organomegaly  Genitalia:    Normal female without lesion, discharge or tenderness, No masses with bimanual exam, NSSA, NT     Extremities:   Extremities normal, atraumatic, no cyanosis or edema  Pulses:   2+ and symmetric all extremities  Skin:   Skin color, texture, turgor normal, no rashes or lesions  Lymph nodes:   Cervical, supraclavicular, and axillary nodes normal  Neurologic:   CNII-XII intact, normal strength, sensation and reflexes    throughout  .    Assessment:    Healthy female exam.    Plan:     Breast self exam technique reviewed and patient encouraged to perform self-exam monthly. Mammogram.

## 2014-10-31 NOTE — Assessment & Plan Note (Signed)
Due for re-eval. 

## 2014-10-31 NOTE — Progress Notes (Signed)
   Subjective:    Patient ID: Jody White, female    DOB: 27-Feb-1951, 64 y.o.   MRN: [id number]  HPI  64 year old female presetns for follow up hypothyroidism.   Sees Gyn for CPX. Last OV today 10/31/2014.   Hypothyroidism:  Due for re-eval on synthroid 125 mcg. Lab Results  Component Value Date   TSH 1.73 09/03/2013     She has also noted new onset rash  On feet. Dry flaky skin , itchy, red. She has tried antifungal OTC spray. No relief.  no toenail issue, no hand issue.  No other rash, no history of skin disorders.  Panic disorder: Previously followed by Dr. Evelene Croon. Treated with alprazolam  Using 1.5 mg tabs at bedtime for insomnia and panic. Rarely needing for  Panic attack during the day. Pt requesting that we start filling this for her monthly. She is agreeable to UDS.   Depression resolved. She had SE to Ambien in past.       Review of Systems  Constitutional: Negative for fever and fatigue.  HENT: Negative for ear pain.   Eyes: Negative for pain.  Respiratory: Negative for chest tightness and shortness of breath.   Cardiovascular: Negative for chest pain, palpitations and leg swelling.  Gastrointestinal: Negative for abdominal pain.  Genitourinary: Negative for dysuria.       Objective:   Physical Exam  Constitutional: Vital signs are normal. She appears well-developed and well-nourished. She is cooperative.  Non-toxic appearance. She does not appear ill. No distress.  HENT:  Head: Normocephalic.  Right Ear: Hearing, tympanic membrane, external ear and ear canal normal. Tympanic membrane is not erythematous, not retracted and not bulging.  Left Ear: Hearing, tympanic membrane, external ear and ear canal normal. Tympanic membrane is not erythematous, not retracted and not bulging.  Nose: No mucosal edema or rhinorrhea. Right sinus exhibits no maxillary sinus tenderness and no frontal sinus tenderness. Left sinus exhibits no maxillary sinus tenderness and no  frontal sinus tenderness.  Mouth/Throat: Uvula is midline, oropharynx is clear and moist and mucous membranes are normal.  Eyes: Conjunctivae, EOM and lids are normal. Pupils are equal, round, and reactive to light. Lids are everted and swept, no foreign bodies found.  Neck: Trachea normal and normal range of motion. Neck supple. Carotid bruit is not present. No thyroid mass and no thyromegaly present.  Cardiovascular: Normal rate, regular rhythm, S1 normal, S2 normal, normal heart sounds, intact distal pulses and normal pulses.  Exam reveals no gallop and no friction rub.   No murmur heard. Pulmonary/Chest: Effort normal and breath sounds normal. No tachypnea. No respiratory distress. She has no decreased breath sounds. She has no wheezes. She has no rhonchi. She has no rales.  Abdominal: Soft. Normal appearance and bowel sounds are normal. There is no tenderness.  Neurological: She is alert.  Skin: Skin is warm, dry and intact. No rash noted.  Flaky red patches on bilateral feet  Psychiatric: Her speech is normal and behavior is normal. Judgment and thought content normal. Her mood appears not anxious. Cognition and memory are normal. She does not exhibit a depressed mood.          Assessment & Plan:

## 2014-10-31 NOTE — Assessment & Plan Note (Signed)
UDS and contract today to be signed.  Once pt due we will fill 90 tabs monthly of alprazolam.  Pt understands risks and agrees to limit use as much as possible.

## 2014-10-31 NOTE — Patient Instructions (Addendum)
Stop at lab on way out for labs and urine drug scree and to sign controlled substance contract. Call when refill needed for antianxiety med. Will need to see you for this about every 6 months.  Use cream on feet daily x 3 weeks. Spray shoes weekly with antifungal spray.  Call if not improving as expected.

## 2014-11-01 ENCOUNTER — Other Ambulatory Visit (INDEPENDENT_AMBULATORY_CARE_PROVIDER_SITE_OTHER): Payer: Medicare Other

## 2014-11-01 DIAGNOSIS — E038 Other specified hypothyroidism: Secondary | ICD-10-CM | POA: Diagnosis not present

## 2014-11-01 DIAGNOSIS — E065 Other chronic thyroiditis: Secondary | ICD-10-CM

## 2014-11-01 LAB — T3, FREE: T3, Free: 2.5 pg/mL (ref 2.3–4.2)

## 2014-11-01 LAB — T4, FREE: Free T4: 1.04 ng/dL (ref 0.60–1.60)

## 2014-11-09 ENCOUNTER — Ambulatory Visit (INDEPENDENT_AMBULATORY_CARE_PROVIDER_SITE_OTHER): Payer: Medicare Other | Admitting: Family Medicine

## 2014-11-09 ENCOUNTER — Encounter: Payer: Self-pay | Admitting: Family Medicine

## 2014-11-09 VITALS — BP 118/64 | HR 74 | Temp 97.9°F | Ht 66.5 in | Wt 211.5 lb

## 2014-11-09 DIAGNOSIS — B353 Tinea pedis: Secondary | ICD-10-CM

## 2014-11-09 MED ORDER — FLUCONAZOLE 100 MG PO TABS: 100.0000 mg | ORAL_TABLET | Freq: Two times a day (BID) | ORAL | Status: DC

## 2014-11-09 NOTE — Progress Notes (Signed)
Pre visit review using our clinic review tool, if applicable. No additional management support is needed unless otherwise documented below in the visit note. 

## 2014-11-09 NOTE — Progress Notes (Signed)
   Subjective:    Patient ID: Jody White, female    DOB: Apr 07, 1950, 64 y.o.   MRN: [id number]  HPI Here for a rash on both feet that itches and burns. She saw Dr. Ermalene Searing a few days ago and was diagnosed with a fungal infection. Her KOH test was positive then. She thinks she picked this up at her apartment since they recently discovered fungus in the carpet from moisture around her HVAC unit. The complex crew has been repairing this. She has been applying ketoconazole cream bid but has seen no improvement.   Review of Systems  Constitutional: Negative.   Skin: Positive for rash.       Objective:   Physical Exam  Constitutional: She appears well-developed and well-nourished.  Skin:  The soles of both feet have small areas of macular erythema, no warmth or tenderness           Assessment & Plan:  Tinea pedis. We will add Diflucan 100 mg bid. Recheck prn

## 2014-11-11 ENCOUNTER — Telehealth: Payer: Self-pay

## 2014-11-11 NOTE — Telephone Encounter (Signed)
PLEASE NOTE: All timestamps contained within this report are represented as [place] Standard Time. CONFIDENTIALTY NOTICE: This fax transmission is intended only for the addressee. It contains information that is legally privileged, confidential or otherwise protected from use or disclosure. If you are not the intended recipient, you are strictly prohibited from reviewing, disclosing, copying using or disseminating any of this information or taking any action in reliance on or regarding this information. If you have received this fax in error, please notify us immediately by telephone so that we can arrange for its return to [place]. Phone: [phone], Toll-Free: [phone], Fax: [phone] Page: 1 of 2 Call Id: [id number] [facility] Night - Client TELEPHONE ADVICE RECORD [facility] Patient Name: Jody White Gender: Female DOB: 03-02-1951 Age: 64 Y 2 M Return Phone Number: [phone] (Primary), [phone] (Secondary) Address: City/State/Zip: [place] Client [facility] Night - Client Client Site [facility] - Night Physician [place], [place] Contact Type Call Call Type Triage / Clinical Relationship To Patient Self Return Phone Number [phone] (Primary) Chief Complaint Foot Pain Initial Comment Caller states has infection in skin of her feet, dr said it is a fungus and gave her meds, redness has increased and is burning, itching very bad PreDisposition Home Care Nurse Assessment Nurse: Enid Skeens, RN, Arline Asp Date/Time (Eastern Time): 11/09/2014 9:40:08 AM Confirm and document reason for call. If symptomatic, describe symptoms. ---Caller states has infection in skin of her feet, dr said it is a fungus and gave her meds, redness has increased and is burning, itching very bad. Has been about 10 days. Has the patient traveled out of the country within the last 30 days? ---No Does the patient require  triage? ---Yes Related visit to physician within the last 2 weeks? ---No Does the PT have any chronic conditions? (i.e. diabetes, asthma, etc.) ---No Guidelines Guideline Title Affirmed Question Affirmed Notes Nurse Date/Time (Eastern Time) Rash or Redness - Localized [1] Localized rash is very painful AND [2] no fever Enid Skeens, RN, Arline Asp 11/09/2014 9:44:13 AM Disp. Time Lamount Cohen Time) Disposition Final User 11/09/2014 9:48:58 AM See Physician within 24 Hours Yes Enid Skeens, RN, Ave Filter Understands: Yes Disagree/Comply: Comply Care Advice Given Per Guideline PLEASE NOTE: All timestamps contained within this report are represented as [place] Standard Time. CONFIDENTIALTY NOTICE: This fax transmission is intended only for the addressee. It contains information that is legally privileged, confidential or otherwise protected from use or disclosure. If you are not the intended recipient, you are strictly prohibited from reviewing, disclosing, copying using or disseminating any of this information or taking any action in reliance on or regarding this information. If you have received this fax in error, please notify us immediately by telephone so that we can arrange for its return to [place]. Phone: [phone], Toll-Free: [phone], Fax: [phone] Page: 2 of 2 Call Id: [id number] Care Advice Given Per Guideline SEE PHYSICIAN WITHIN 24 HOURS: * IF OFFICE WILL BE CLOSED AND NO PCP TRIAGE: You need to be seen within the next 24 hours. An urgent care center is often a good source of care if your doctor's office is closed. CALL BACK IF: * Fever occurs * You become worse. CARE ADVICE given per Rash - Localized and Cause Unknown (Adult) guideline. After Care Instructions Given Call Event Type User Date / Time Description Referrals [facility]

## 2014-11-11 NOTE — Telephone Encounter (Signed)
Pt was seen at [facility] 11/09/14.

## 2014-11-14 ENCOUNTER — Encounter: Payer: Self-pay | Admitting: Family Medicine

## 2014-11-14 ENCOUNTER — Other Ambulatory Visit: Payer: Self-pay | Admitting: Family Medicine

## 2014-11-14 NOTE — Telephone Encounter (Signed)
Last office visit 10/31/2014.  Last refilled 10/31/2014 for 30 grams.  Ok to refill?

## 2014-11-18 ENCOUNTER — Encounter: Payer: Self-pay | Admitting: *Deleted

## 2014-11-18 DIAGNOSIS — B353 Tinea pedis: Secondary | ICD-10-CM | POA: Diagnosis not present

## 2014-11-23 ENCOUNTER — Other Ambulatory Visit: Payer: Self-pay | Admitting: Family Medicine

## 2014-12-17 DIAGNOSIS — B353 Tinea pedis: Secondary | ICD-10-CM | POA: Diagnosis not present

## 2014-12-24 DIAGNOSIS — B353 Tinea pedis: Secondary | ICD-10-CM | POA: Diagnosis not present

## 2014-12-24 DIAGNOSIS — L308 Other specified dermatitis: Secondary | ICD-10-CM | POA: Diagnosis not present

## 2015-01-26 ENCOUNTER — Emergency Department
Admission: EM | Admit: 2015-01-26 | Discharge: 2015-01-26 | Disposition: A | Discharge status: Home / Self Care | Payer: Medicare Other | Attending: Emergency Medicine | Admitting: Emergency Medicine

## 2015-01-26 ENCOUNTER — Encounter: Payer: Self-pay | Admitting: Emergency Medicine

## 2015-01-26 ENCOUNTER — Emergency Department: Payer: Medicare Other

## 2015-01-26 DIAGNOSIS — R531 Weakness: Secondary | ICD-10-CM | POA: Diagnosis not present

## 2015-01-26 DIAGNOSIS — R404 Transient alteration of awareness: Secondary | ICD-10-CM | POA: Diagnosis not present

## 2015-01-26 DIAGNOSIS — R209 Unspecified disturbances of skin sensation: Secondary | ICD-10-CM | POA: Diagnosis not present

## 2015-01-26 DIAGNOSIS — R202 Paresthesia of skin: Secondary | ICD-10-CM | POA: Insufficient documentation

## 2015-01-26 DIAGNOSIS — R200 Anesthesia of skin: Secondary | ICD-10-CM | POA: Diagnosis not present

## 2015-01-26 DIAGNOSIS — Z792 Long term (current) use of antibiotics: Secondary | ICD-10-CM | POA: Insufficient documentation

## 2015-01-26 DIAGNOSIS — F419 Anxiety disorder, unspecified: Secondary | ICD-10-CM | POA: Insufficient documentation

## 2015-01-26 DIAGNOSIS — Z79899 Other long term (current) drug therapy: Secondary | ICD-10-CM | POA: Diagnosis not present

## 2015-01-26 LAB — CBC
HCT: 38.2 % (ref 35.0–47.0)
Hemoglobin: 12.9 g/dL (ref 12.0–16.0)
MCH: 29.5 pg (ref 26.0–34.0)
MCHC: 33.8 g/dL (ref 32.0–36.0)
MCV: 87.2 fL (ref 80.0–100.0)
PLATELETS: 285 10*3/uL (ref 150–440)
RBC: 4.38 MIL/uL (ref 3.80–5.20)
RDW: 13.6 % (ref 11.5–14.5)
WBC: 4 10*3/uL (ref 3.6–11.0)

## 2015-01-26 LAB — URINALYSIS COMPLETE WITH MICROSCOPIC (ARMC ONLY)
BACTERIA UA: NONE SEEN
Bilirubin Urine: NEGATIVE
GLUCOSE, UA: NEGATIVE mg/dL
Ketones, ur: NEGATIVE mg/dL
NITRITE: NEGATIVE
Protein, ur: NEGATIVE mg/dL
SPECIFIC GRAVITY, URINE: 1.009 (ref 1.005–1.030)
pH: 6 (ref 5.0–8.0)

## 2015-01-26 LAB — BASIC METABOLIC PANEL
Anion gap: 2 — ABNORMAL LOW (ref 5–15)
BUN: 10 mg/dL (ref 6–20)
CALCIUM: 9.2 mg/dL (ref 8.9–10.3)
CO2: 29 mmol/L (ref 22–32)
CREATININE: 0.65 mg/dL (ref 0.44–1.00)
Chloride: 108 mmol/L (ref 101–111)
Glucose, Bld: 151 mg/dL — ABNORMAL HIGH (ref 65–99)
Potassium: 3.8 mmol/L (ref 3.5–5.1)
SODIUM: 139 mmol/L (ref 135–145)

## 2015-01-26 LAB — GLUCOSE, CAPILLARY: GLUCOSE-CAPILLARY: 106 mg/dL — AB (ref 65–99)

## 2015-01-26 LAB — TSH: TSH: 1.54 u[IU]/mL (ref 0.350–4.500)

## 2015-01-26 NOTE — ED Notes (Signed)
Discussed discharge instructions and follow-up care with patient. No questions or concerns at this time. Pt stable at discharge.  

## 2015-01-26 NOTE — Discharge Instructions (Signed)
Panic Attacks °Panic attacks are sudden, short-lived surges of severe anxiety, fear, or discomfort. They may occur for no reason when you are relaxed, when you are anxious, or when you are sleeping. Panic attacks may occur for a number of reasons:  °· Healthy people occasionally have panic attacks in extreme, life-threatening situations, such as war or natural disasters. Normal anxiety is a protective mechanism of the body that helps us react to danger (fight or flight response). °· Panic attacks are often seen with anxiety disorders, such as panic disorder, social anxiety disorder, generalized anxiety disorder, and phobias. Anxiety disorders cause excessive or uncontrollable anxiety. They may interfere with your relationships or other life activities. °· Panic attacks are sometimes seen with other mental illnesses, such as depression and posttraumatic stress disorder. °· Certain medical conditions, prescription medicines, and drugs of abuse can cause panic attacks. °SYMPTOMS  °Panic attacks start suddenly, peak within 20 minutes, and are accompanied by four or more of the following symptoms: °· Pounding heart or fast heart rate (palpitations). °· Sweating. °· Trembling or shaking. °· Shortness of breath or feeling smothered. °· Feeling choked. °· Chest pain or discomfort. °· Nausea or strange feeling in your stomach. °· Dizziness, light-headedness, or feeling like you will faint. °· Chills or hot flushes. °· Numbness or tingling in your lips or hands and feet. °· Feeling that things are not real or feeling that you are not yourself. °· Fear of losing control or going crazy. °· Fear of dying. °Some of these symptoms can mimic serious medical conditions. For example, you may think you are having a heart attack. Although panic attacks can be very scary, they are not life threatening. °DIAGNOSIS  °Panic attacks are diagnosed through an assessment by your health care provider. Your health care provider will ask  questions about your symptoms, such as where and when they occurred. Your health care provider will also ask about your medical history and use of alcohol and drugs, including prescription medicines. Your health care provider may order blood tests or other studies to rule out a serious medical condition. Your health care provider may refer you to a mental health professional for further evaluation. °TREATMENT  °· Most healthy people who have one or two panic attacks in an extreme, life-threatening situation will not require treatment. °· The treatment for panic attacks associated with anxiety disorders or other mental illness typically involves counseling with a mental health professional, medicine, or a combination of both. Your health care provider will help determine what treatment is best for you. °· Panic attacks due to physical illness usually go away with treatment of the illness. If prescription medicine is causing panic attacks, talk with your health care provider about stopping the medicine, decreasing the dose, or substituting another medicine. °· Panic attacks due to alcohol or drug abuse go away with abstinence. Some adults need professional help in order to stop drinking or using drugs. °HOME CARE INSTRUCTIONS  °· Take all medicines as directed by your health care provider.   °· Schedule and attend follow-up visits as directed by your health care provider. It is important to keep all your appointments. °SEEK MEDICAL CARE IF: °· You are not able to take your medicines as prescribed. °· Your symptoms do not improve or get worse. °SEEK IMMEDIATE MEDICAL CARE IF:  °· You experience panic attack symptoms that are different than your usual symptoms. °· You have serious thoughts about hurting yourself or others. °· You are taking medicine for panic attacks and   have a serious side effect. °MAKE SURE YOU: °· Understand these instructions. °· Will watch your condition. °· Will get help right away if you are not  doing well or get worse. °  °This information is not intended to replace advice given to you by your health care provider. Make sure you discuss any questions you have with your health care provider. °  °Document Released: 03/08/2005 Document Revised: 03/13/2013 Document Reviewed: 10/20/2012 °Elsevier Interactive Patient Education ©2016 [employer] °Paresthesia °Paresthesia is an abnormal burning or prickling sensation. This sensation is generally felt in the hands, arms, legs, or feet. However, it may occur in any part of the body. Usually, it is not painful. The feeling may be described as: °· Tingling or numbness. °· Pins and needles. °· Skin crawling. °· Buzzing. °· Limbs falling asleep. °· Itching. °Most people experience temporary (transient) paresthesia at some time in their lives. Paresthesia may occur when you breathe too quickly (hyperventilation). It can also occur without any apparent cause. Commonly, paresthesia occurs when pressure is placed on a nerve. The sensation quickly goes away after the pressure is removed. For some people, however, paresthesia is a long-lasting (chronic) condition that is caused by an underlying disorder. If you continue to have paresthesia, you may need further medical evaluation. °HOME CARE INSTRUCTIONS °Watch your condition for any changes. Taking the following actions may help to lessen any discomfort that you are feeling: °· Avoid drinking alcohol. °· Try acupuncture or massage to help relieve your symptoms. °· Keep all follow-up visits as directed by your health care provider. This is important. °SEEK MEDICAL CARE IF: °· You continue to have episodes of paresthesia. °· Your burning or prickling feeling gets worse when you walk. °· You have pain, cramps, or dizziness. °· You develop a rash. °SEEK IMMEDIATE MEDICAL CARE IF: °· You feel weak. °· You have trouble walking or moving. °· You have problems with speech, understanding, or vision. °· You feel confused. °· You  cannot control your bladder or bowel movements. °· You have numbness after an injury. °· You faint. °  °This information is not intended to replace advice given to you by your health care provider. Make sure you discuss any questions you have with your health care provider. °  °Document Released: 02/26/2002 Document Revised: 07/23/2014 Document Reviewed: 03/04/2014 °Elsevier Interactive Patient Education ©2016 Elsevier Inc. ° °

## 2015-01-26 NOTE — ED Notes (Signed)
[facility], per patient she started having numbness all over her face that started on Thursday. Husband reports that this morning he went into the living room and patient appeared to be having a "panic attack." Husband states that the patient was pale and slightly disoriented.

## 2015-01-26 NOTE — ED Provider Notes (Signed)
[facility] Emergency Department Provider Note REMINDER - THIS NOTE IS NOT A FINAL MEDICAL RECORD UNTIL IT IS SIGNED. UNTIL THEN, THE CONTENT BELOW MAY REFLECT INFORMATION FROM A DOCUMENTATION TEMPLATE, NOT THE ACTUAL PATIENT VISIT. ____________________________________________  Time seen: Approximately 11:14 AM  I have reviewed the triage vital signs and the nursing notes.   HISTORY  Chief Complaint Weakness    HPI Jody White is a 64 y.o. female who presents for evaluation of having some tingling and slight numbing feeling over the face, arms, and lower legs for the last 3-4 days. This sensation is noted on both sides of the face, both arms especially around the hands, and feet.  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 She recently stopped her Xanax. In addition, the patient reports that she has been feeling tired and fatigued for the last several weeks. She is also noticed some slight increase in hair loss.  No headache, no chest pain. No weakness in the arms or legs. No trouble walking. No trouble speaking.  Past Medical History  Diagnosis Date  . 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 B12 deficiency 11/09/2011  . Vitamin D deficiency 11/09/2011  . Persistent disorder of initiating or maintaining sleep 11/09/2011  . Panic disorder without agoraphobia 11/09/2011  . Hypothyroid 11/09/2011  . H/O gastric bypass 11/09/2011  . Chronic fatigue syndrome 11/09/2011  . 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 Confirmed with [employer] [employer] testing [place]   . Anxiety   . Obesity     Patient Active Problem List   Diagnosis Date Noted  . Tinea pedis of both feet 10/31/2014  . Iron deficiency anemia 01/24/2012  . 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 B12 deficiency 11/09/2011  . Vitamin D  deficiency 11/09/2011  . Chronic fatigue syndrome 11/09/2011  . H/O gastric bypass 11/09/2011  . Hypothyroid 11/09/2011  . Panic disorder without agoraphobia 11/09/2011  . Persistent disorder of initiating or maintaining sleep 11/09/2011    Past Surgical History  Procedure Laterality Date  . Gastric bypass  2002  . Breast surgery    . Cholecystectomy    . Tubal ligation      Current Outpatient Rx  Name  Route  Sig  Dispense  Refill  . ALPRAZolam (XANAX) 0.5 MG tablet   Oral   Take 0.5 mg by mouth 3 (three) times daily as needed.         . Butenafine HCl (LOTRIMIN ULTRA EX)   Apply externally   Apply topically.         . fluconazole (DIFLUCAN) 100 MG tablet   Oral   Take 1 tablet (100 mg total) by mouth 2 (two) times daily.   30 tablet   0   . ketoconazole (NIZORAL) 2 % cream      APPLY 1 APPLICATION TOPICALLY DAILY.   30 g   0   . SYNTHROID 125 MCG tablet      TAKE ONE TABLET BY MOUTH DAILY   30 tablet   11     Allergies Review of patient's allergies indicates no known allergies.  Family History  Problem Relation Age of Onset  . Alcohol abuse Father     Social History Social History  Substance Use Topics  . Smoking status: Never Smoker   . Smokeless tobacco: Never Used  . Alcohol  Use: Yes     Comment: wine occassionally    Review of Systems Constitutional: No fever/chills Eyes: No visual changes. ENT: No sore throat. Cardiovascular: Denies chest pain. Respiratory: Denies shortness of breath. Gastrointestinal: No abdominal pain.  No nausea, no vomiting.  No diarrhea.  No constipation. Genitourinary: Negative for dysuria. Musculoskeletal: Negative for back pain. Skin: Negative for rash. Neurological: Negative for headaches, focal weakness. Does report numbness as noted.  10-point ROS otherwise negative.  ____________________________________________   PHYSICAL EXAM:  VITAL SIGNS: ED Triage Vitals  Enc Vitals Group     BP 01/26/15  1027 138/64 mmHg     Pulse Rate 01/26/15 1027 71     Resp 01/26/15 1027 11     Temp 01/26/15 1027 98.1 F (36.7 C)     Temp src --      SpO2 01/26/15 1027 100 %     Weight 01/26/15 1027 203 lb 12.8 oz (92.443 kg)     Height 01/26/15 1027  (1.676 m)     Head Cir --      Peak Flow --      Pain Score 01/26/15 1029 0     Pain Loc --      Pain Edu? --      Excl. in GC? --    Constitutional: Alert and oriented. Well appearing and in no acute distress. Eyes: Conjunctivae are normal. PERRL. EOMI. Head: Atraumatic. Nose: No congestion/rhinnorhea. Mouth/Throat: Mucous membranes are moist.  Oropharynx non-erythematous. Neck: No stridor.   Cardiovascular: Normal rate, regular rhythm. Grossly normal heart sounds.  Good peripheral circulation. Respiratory: Normal respiratory effort.  No retractions. Lungs CTAB. Gastrointestinal: Soft and nontender. No distention. No abdominal bruits. No CVA tenderness. Musculoskeletal: No lower extremity tenderness nor edema.  No joint effusions. Neurologic:  Normal speech and language. No gross focal neurologic deficits are appreciated.   NIH score equals 0, performed by me at bedside. The patient has no pronator drift. The patient has normal cranial nerve exam. Extraocular movements are normal. Visual fields are normal. Patient has 5 out of 5 strength in all extremities. There is no numbness or gross, acute sensory abnormality in the extremities bilaterally on objective testing and able to discriminate light touch well... Does report subjective "numbing" feeling on all extremities and face bilateral. No speech disturbance. No dysarthria. No aphasia. No ataxia. Normal finger nose finger bilat. Patient speaking in full and clear sentences.   Skin:  Skin is warm, dry and intact. No rash noted. Psychiatric: Mood and affect are normal. Speech and behavior are normal.  ____________________________________________   LABS (all labs ordered are  listed, but only abnormal results are displayed)  Labs Reviewed  URINALYSIS COMPLETEWITH MICROSCOPIC ([facility] ONLY) - Abnormal; Notable for the following:    Color, Urine YELLOW (*)    APPearance CLEAR (*)    Hgb urine dipstick 1+ (*)    Leukocytes, UA TRACE (*)    Squamous Epithelial / LPF 0-5 (*)    All other components within normal limits  BASIC METABOLIC PANEL - Abnormal; Notable for the following:    Glucose, Bld 151 (*)    Anion gap 2 (*)    All other components within normal limits  GLUCOSE, CAPILLARY - Abnormal; Notable for the following:    Glucose-Capillary 106 (*)    All other components within normal limits  URINE CULTURE  CBC  TSH  CBG MONITORING, ED   ____________________________________________  EKG  ED ECG REPORT I, Natali Lavallee, the attending  physician, personally viewed and interpreted this ECG.  Date: 01/26/2015 EKG Time: 1025 Rate: 70 Rhythm: normal sinus rhythm QRS Axis: normal Intervals: normal ST/T Wave abnormalities: normal Conduction Disutrbances: none Narrative Interpretation: unremarkable  ____________________________________________  RADIOLOGY  CT Head Wo Contrast (Final result) Result time: 01/26/15 12:40:48   Final result by Rad Results In Interface (01/26/15 12:40:48)   Narrative:   CLINICAL DATA: Diffuse facial numbness for the past 3 days.  EXAM: CT HEAD WITHOUT CONTRAST  TECHNIQUE: Contiguous axial images were obtained from the base of the skull through the vertex without intravenous contrast.  COMPARISON: None.  FINDINGS: Diffusely enlarged ventricles and subarachnoid spaces. No intracranial hemorrhage, mass lesion or CT evidence of acute infarction. Unremarkable bones and included paranasal sinuses.  IMPRESSION: No acute abnormality. Mild diffuse cerebral and cerebellar atrophy.    ____________________________________________   PROCEDURES  Procedure(s) performed: None  Critical Care performed:  No  ____________________________________________   INITIAL IMPRESSION / ASSESSMENT AND PLAN / ED COURSE  Pertinent labs & imaging results that were available during my care of the patient were reviewed by me and considered in my medical decision making (see chart for details).  Patient presents for generalized weakness without focality as well as paresthesias for the last 3-4 days. Her exam is very reassuring, she is no distress. Her hemodynamics are normal. This is slightly unclear as to an obvious cause, but I highly doubt acute stroke given the bilateral nature of her paresthesias also in the setting of her panic disorder, hyperventilation is considered. We will check electrolytes including sodium and potassium, thyroid, urinalysis. If labs are unremarkable and CT is normal I would feel competent the patient could be discharged and follow up with her Dr. Dallas Schim[place].  She is very pleasant, in no distress. Reassuring exam at this time. EKG normal, no cardiopulmonary symptoms.  ----------------------------------------- 2:39 PM on 01/26/2015 -----------------------------------------  Review the patient's lab results and CT. I find no evidence of acute abnormality. Again, though she does have bilateral paresthesias in her CT does not demonstrate any acute evidence of infarction or previous infarction, her symptoms are not typical of a stroke given the bilateral nature and association with what appears to be frequent panic attacks. As we reviewed her discharge, her husband noted that she has frequent episodes like this which have been diagnosed for years as "panic" attacks. At this point, I discussed with the patient careful return precautions and follow-up care. She'll follow-up closely with her doctor. ____________________________________________   FINAL CLINICAL IMPRESSION(S) / ED DIAGNOSES  Final diagnoses:  Anxiety  Paresthesia      Sharyn CreamerMark Kristena Wilhelmi, MD 01/26/15 1440

## 2015-01-28 ENCOUNTER — Encounter: Payer: Self-pay | Admitting: Family Medicine

## 2015-01-28 ENCOUNTER — Ambulatory Visit (INDEPENDENT_AMBULATORY_CARE_PROVIDER_SITE_OTHER): Payer: BLUE CROSS/BLUE SHIELD | Admitting: Family Medicine

## 2015-01-28 VITALS — BP 102/60 | HR 74 | Temp 98.6°F | Ht 66.5 in | Wt 202.5 lb

## 2015-01-28 DIAGNOSIS — G4700 Insomnia, unspecified: Secondary | ICD-10-CM

## 2015-01-28 DIAGNOSIS — R202 Paresthesia of skin: Secondary | ICD-10-CM | POA: Diagnosis not present

## 2015-01-28 DIAGNOSIS — F410 Panic disorder [episodic paroxysmal anxiety] without agoraphobia: Secondary | ICD-10-CM

## 2015-01-28 DIAGNOSIS — R200 Anesthesia of skin: Secondary | ICD-10-CM

## 2015-01-28 LAB — URINE CULTURE: Special Requests: NORMAL

## 2015-01-28 NOTE — Assessment & Plan Note (Signed)
Poor cotnrol. Disucussed [facility] as option since pt would like to avoid prescription medication. She will remain off alprazolam.

## 2015-01-28 NOTE — Progress Notes (Signed)
Subjective:    Patient ID: Jody White, female    DOB: [birth year], 64 y.o.   MRN: [id number]  HPI  64 year old female pt with history of chronic fatigue syndrome, hypothyroid, panic disorder , insomnia presents for follow up ER visit.  She was seen at [facility] on 01/26/2015 for  Tingling numbness on face arms and legs for 3-4 days. She had recently stopped her Xanax in an effort to wean off meds over a few weeks (last dose 2 days ago, she was using at night) and has been feeling more anxious, and fatigued in the last few weeks.  She was diagnosed with panic attack after  nml neuro exam. NIH score 0  Labs showed  Clear UA, glucose non fasting 106,  TSH 1.54 on synthroid, cbc nml, EKG unremarkable CT head no acute changes.  She reports continued numbness in mouth and face. Feels teeth are not aligned. Less so in the rest of her body.  She is feeling some better than 2 days ago in ER>  Resolved weakness now.  She has had spells of weakness since she was 16. Usually in times of stress.  She has less appetite and had some hair loss in last few months. She has had some weight loss.  She feels like she is hyper at times.  She feels very cold a lot. Wt Readings from Last 3 Encounters:  01/28/15 202 lb 8 oz (91.853 kg)  01/26/15 203 lb 12.8 oz (92.443 kg)  11/09/14 211 lb 8 oz (95.936 kg)   B12 261 in 10/2014 She has NOT  been taking vit D for low vit D noted 10/2014.  She does not like the way melatonin or benadryl makes her feel.  She would not like to use a controlled substance for sleep.  Social History /Family History/Past Medical History reviewed and updated if needed.  Review of Systems  Constitutional: Positive for fatigue. Negative for fever.  HENT: Negative for ear pain.   Eyes: Negative for pain.  Respiratory: Negative for cough.   Cardiovascular: Negative for chest pain.  Gastrointestinal: Negative for abdominal pain.       Objective:   Physical Exam    Constitutional: She is oriented to person, place, and time. Vital signs are normal. She appears well-developed and well-nourished. She is cooperative.  Non-toxic appearance. She does not appear ill. No distress.  HENT:  Head: Normocephalic.  Right Ear: Hearing, tympanic membrane, external ear and ear canal normal. Tympanic membrane is not erythematous, not retracted and not bulging.  Left Ear: Hearing, tympanic membrane, external ear and ear canal normal. Tympanic membrane is not erythematous, not retracted and not bulging.  Nose: No mucosal edema or rhinorrhea. Right sinus exhibits no maxillary sinus tenderness and no frontal sinus tenderness. Left sinus exhibits no maxillary sinus tenderness and no frontal sinus tenderness.  Mouth/Throat: Uvula is midline, oropharynx is clear and moist and mucous membranes are normal.  Eyes: Conjunctivae, EOM and lids are normal. Pupils are equal, round, and reactive to light. Lids are everted and swept, no foreign bodies found.  Neck: Trachea normal and normal range of motion. Neck supple. Carotid bruit is not present. No thyroid mass and no thyromegaly present.  Cardiovascular: Normal rate, regular rhythm, S1 normal, S2 normal, normal heart sounds, intact distal pulses and normal pulses.  Exam reveals no gallop and no friction rub.   No murmur heard. Pulmonary/Chest: Effort normal and breath sounds normal. No tachypnea. No respiratory distress. She has  no decreased breath sounds. She has no wheezes. She has no rhonchi. She has no rales.  Abdominal: Soft. Normal appearance and bowel sounds are normal. There is no tenderness.  Neurological: She is alert and oriented to person, place, and time. She has normal strength and normal reflexes. No cranial nerve deficit or sensory deficit. She exhibits normal muscle tone. She displays a negative Romberg sign. Coordination and gait normal. GCS eye subscore is 4. GCS verbal subscore is 5. GCS motor subscore is 6.  Nml  cerebellar exam   No papilledema  Skin: Skin is warm, dry and intact. No rash noted.  Psychiatric: She has a normal mood and affect. Her speech is normal and behavior is normal. Judgment and thought content normal. Her mood appears not anxious. Cognition and memory are normal. Cognition and memory are not impaired. She does not exhibit a depressed mood. She exhibits normal recent memory and normal remote memory.          Assessment & Plan:

## 2015-01-28 NOTE — Patient Instructions (Addendum)
Start weekly vit D supplement. Then follow it with over the counter Vit D3  400 IU twice daily. Work on Geneticist, molecularhealthy sleeping hygiene. Start sleep protocol. Consider a sound machine for white noise.  Increase exercise earlier in the day. Consider sleeping in a different room from spouse. Can consider [employer] for anxiety.

## 2015-01-28 NOTE — Assessment & Plan Note (Signed)
Counseled and info given on sleep hygeine and sleep protocol.

## 2015-01-28 NOTE — Progress Notes (Signed)
Pre visit review using our clinic review tool, if applicable. No additional management support is needed unless otherwise documented below in the visit note. 

## 2015-01-28 NOTE — Assessment & Plan Note (Signed)
Nml neuro exam. Nml labs, nml head CT, likely due to anxiety and panic attack.

## 2015-02-28 ENCOUNTER — Ambulatory Visit (INDEPENDENT_AMBULATORY_CARE_PROVIDER_SITE_OTHER): Payer: BLUE CROSS/BLUE SHIELD | Admitting: Family Medicine

## 2015-02-28 ENCOUNTER — Encounter: Payer: Self-pay | Admitting: Family Medicine

## 2015-02-28 ENCOUNTER — Telehealth: Payer: Self-pay

## 2015-02-28 ENCOUNTER — Telehealth: Payer: Self-pay | Admitting: Family Medicine

## 2015-02-28 VITALS — BP 106/70 | HR 65 | Temp 98.7°F | Ht 66.5 in | Wt 203.0 lb

## 2015-02-28 DIAGNOSIS — N63 Unspecified lump in breast: Secondary | ICD-10-CM | POA: Diagnosis not present

## 2015-02-28 DIAGNOSIS — N6320 Unspecified lump in the left breast, unspecified quadrant: Secondary | ICD-10-CM | POA: Insufficient documentation

## 2015-02-28 DIAGNOSIS — R55 Syncope and collapse: Secondary | ICD-10-CM | POA: Diagnosis not present

## 2015-02-28 DIAGNOSIS — F5104 Psychophysiologic insomnia: Secondary | ICD-10-CM

## 2015-02-28 DIAGNOSIS — G4700 Insomnia, unspecified: Secondary | ICD-10-CM

## 2015-02-28 MED ORDER — ZOLPIDEM TARTRATE 5 MG PO TABS: 5.0000 mg | ORAL_TABLET | Freq: Every evening | ORAL | Status: DC | PRN

## 2015-02-28 NOTE — Assessment & Plan Note (Signed)
Restart ambien but low dose as she knows she tolerates it well.

## 2015-02-28 NOTE — Progress Notes (Signed)
   Subjective:    Patient ID: Jody White, female    DOB: 08/28/1950, 64 y.o.   MRN: [id number]  HPI  64 year old female presents with 2 issues to address.Marland Kitchen.   Poor control of chronic insomnia: No improvement with sleep hygiene, sleep retraining. No relief with melatonin or valerian root. SE to both as well Getting 4-5 hours a night. Increased stress with her husband's anxiety. In past she tried Ambien in past... Stopped working.  New onset mass in left breast. lower. She has noted lump in last 1-2 years ( ? Scar tissue from past reduction surgery, now feeling  Pulling in last 4-6 weeks.   No change lump, no redness, no pain.  No fever.  No skin changes, no  Nipple changes.   Last mammogram:  10 years ago.   Social History /Family History/Past Medical History reviewed and updated if needed. No family or personal history of breast cancer  Hx of breast reduction.  Review of Systems  Constitutional: Negative for fever and fatigue.  HENT: Negative for ear pain.   Eyes: Negative for pain.  Respiratory: Negative for chest tightness and shortness of breath.   Cardiovascular: Negative for chest pain, palpitations and leg swelling.  Gastrointestinal: Negative for abdominal pain.  Genitourinary: Negative for dysuria.       Objective:   Physical Exam  Constitutional: Vital signs are normal. She appears well-developed and well-nourished. She is cooperative.  Non-toxic appearance. She does not appear ill. No distress.  HENT:  Head: Normocephalic.  Right Ear: Hearing, tympanic membrane, external ear and ear canal normal. Tympanic membrane is not erythematous, not retracted and not bulging.  Left Ear: Hearing, tympanic membrane, external ear and ear canal normal. Tympanic membrane is not erythematous, not retracted and not bulging.  Nose: No mucosal edema or rhinorrhea. Right sinus exhibits no maxillary sinus tenderness and no frontal sinus tenderness. Left sinus exhibits no maxillary  sinus tenderness and no frontal sinus tenderness.  Mouth/Throat: Uvula is midline, oropharynx is clear and moist and mucous membranes are normal.  Eyes: Conjunctivae, EOM and lids are normal. Pupils are equal, round, and reactive to light. Lids are everted and swept, no foreign bodies found.  Neck: Trachea normal and normal range of motion. Neck supple. Carotid bruit is not present. No thyroid mass and no thyromegaly present.  Cardiovascular: Normal rate, regular rhythm, S1 normal, S2 normal, normal heart sounds, intact distal pulses and normal pulses.  Exam reveals no gallop and no friction rub.   No murmur heard. Pulmonary/Chest: Effort normal and breath sounds normal. No tachypnea. No respiratory distress. She has no decreased breath sounds. She has no wheezes. She has no rhonchi. She has no rales.  Abdominal: Soft. Normal appearance and bowel sounds are normal. There is no tenderness.  Genitourinary: There is breast tenderness. No breast swelling, discharge or bleeding.  Left breast mass noted at site of surgical scar from reduction at 6 o'clock.. 1 inch oblong, mobile, tender      Neurological: She is alert.  Skin: Skin is warm, dry and intact. No rash noted.  Psychiatric: Her speech is normal and behavior is normal. Judgment and thought content normal. Her mood appears not anxious. Cognition and memory are normal. She does not exhibit a depressed mood.          Assessment & Plan:

## 2015-02-28 NOTE — Assessment & Plan Note (Signed)
Eval with U S and mammogram.. May be scar tissue given present long time.

## 2015-02-28 NOTE — Telephone Encounter (Signed)
Pt left v/m for Jody White CMA; [facility] and all her information has been updated.

## 2015-02-28 NOTE — Telephone Encounter (Signed)
[place]oke with [place]

## 2015-02-28 NOTE — Telephone Encounter (Signed)
Noted  

## 2015-02-28 NOTE — Progress Notes (Signed)
Pre visit review using our clinic review tool, if applicable. No additional management support is needed unless otherwise documented below in the visit note. 

## 2015-02-28 NOTE — Assessment & Plan Note (Signed)
Correction of diagnosis

## 2015-03-07 ENCOUNTER — Ambulatory Visit
Admission: RE | Admit: 2015-03-07 | Discharge: 2015-03-07 | Disposition: A | Discharge status: Home / Self Care | Payer: Medicare Other | Source: Ambulatory Visit | Attending: Family Medicine | Admitting: Family Medicine

## 2015-03-07 ENCOUNTER — Other Ambulatory Visit: Payer: Self-pay | Admitting: Family Medicine

## 2015-03-07 DIAGNOSIS — N6320 Unspecified lump in the left breast, unspecified quadrant: Secondary | ICD-10-CM

## 2015-03-07 DIAGNOSIS — R928 Other abnormal and inconclusive findings on diagnostic imaging of breast: Secondary | ICD-10-CM | POA: Diagnosis not present

## 2015-03-11 ENCOUNTER — Telehealth: Payer: Self-pay | Admitting: Family Medicine

## 2015-03-11 ENCOUNTER — Telehealth: Payer: Self-pay | Admitting: *Deleted

## 2015-03-11 NOTE — Telephone Encounter (Signed)
Ms. Jody White states she cancelled her prescription for Ambien because she did not want to take it due to the side effects.  Medication list updated.  She also states that she had her mammogram and everything was normal.  Report is already in chart.

## 2015-03-11 NOTE — Telephone Encounter (Signed)
Pt came in today with her husband for am appt. She wanted Dr. Ermalene SearingBedsole to be aware that she got her mammo results back and they are negative. Also she does not want to take the Ambien. The best number to contact her is [phone].

## 2015-04-28 ENCOUNTER — Other Ambulatory Visit: Payer: Self-pay | Admitting: Family Medicine

## 2015-04-28 NOTE — Telephone Encounter (Signed)
Last office visit 02/28/2015. Not on current medication list. Ok to refill?

## 2015-04-29 MED ORDER — DICYCLOMINE HCL 20 MG PO TABS: 20.0000 mg | ORAL_TABLET | Freq: Every day | ORAL | Status: DC | PRN

## 2015-04-29 NOTE — Telephone Encounter (Signed)
Received fax request specific frequency instructions on prescription for dicyclomine.   Resent.

## 2015-04-29 NOTE — Addendum Note (Signed)
Addended by: Damita Lack on: 04/29/2015 09:37 AM   Modules accepted: Orders

## 2015-05-01 ENCOUNTER — Ambulatory Visit: Payer: Medicare Other | Admitting: Family Medicine

## 2015-05-06 ENCOUNTER — Ambulatory Visit: Payer: Medicare Other | Admitting: Family Medicine

## 2015-08-14 DIAGNOSIS — R5383 Other fatigue: Secondary | ICD-10-CM | POA: Diagnosis not present

## 2015-08-14 DIAGNOSIS — E039 Hypothyroidism, unspecified: Secondary | ICD-10-CM | POA: Diagnosis not present

## 2015-08-21 DIAGNOSIS — R5383 Other fatigue: Secondary | ICD-10-CM | POA: Diagnosis not present

## 2015-08-21 DIAGNOSIS — E039 Hypothyroidism, unspecified: Secondary | ICD-10-CM | POA: Diagnosis not present

## 2015-08-21 DIAGNOSIS — E559 Vitamin D deficiency, unspecified: Secondary | ICD-10-CM | POA: Diagnosis not present

## 2015-08-21 DIAGNOSIS — Z0000 Encounter for general adult medical examination without abnormal findings: Secondary | ICD-10-CM | POA: Diagnosis not present

## 2015-08-25 DIAGNOSIS — K5730 Diverticulosis of large intestine without perforation or abscess without bleeding: Secondary | ICD-10-CM | POA: Diagnosis not present

## 2015-08-25 DIAGNOSIS — E039 Hypothyroidism, unspecified: Secondary | ICD-10-CM | POA: Diagnosis not present

## 2015-08-25 DIAGNOSIS — R0602 Shortness of breath: Secondary | ICD-10-CM | POA: Diagnosis not present

## 2015-08-25 DIAGNOSIS — N281 Cyst of kidney, acquired: Secondary | ICD-10-CM | POA: Diagnosis not present

## 2015-08-25 DIAGNOSIS — R42 Dizziness and giddiness: Secondary | ICD-10-CM | POA: Diagnosis not present

## 2015-08-25 DIAGNOSIS — R109 Unspecified abdominal pain: Secondary | ICD-10-CM | POA: Diagnosis not present

## 2015-08-25 DIAGNOSIS — Z9049 Acquired absence of other specified parts of digestive tract: Secondary | ICD-10-CM | POA: Diagnosis not present

## 2015-08-25 DIAGNOSIS — R10819 Abdominal tenderness, unspecified site: Secondary | ICD-10-CM | POA: Diagnosis not present

## 2015-08-25 DIAGNOSIS — K625 Hemorrhage of anus and rectum: Secondary | ICD-10-CM | POA: Diagnosis not present

## 2015-08-25 DIAGNOSIS — Z9884 Bariatric surgery status: Secondary | ICD-10-CM | POA: Diagnosis not present

## 2015-08-25 DIAGNOSIS — R9431 Abnormal electrocardiogram [ECG] [EKG]: Secondary | ICD-10-CM | POA: Diagnosis not present

## 2015-09-02 DIAGNOSIS — E039 Hypothyroidism, unspecified: Secondary | ICD-10-CM | POA: Diagnosis not present

## 2015-09-02 DIAGNOSIS — K921 Melena: Secondary | ICD-10-CM | POA: Diagnosis not present

## 2015-09-26 DIAGNOSIS — K921 Melena: Secondary | ICD-10-CM | POA: Diagnosis not present

## 2015-10-10 DIAGNOSIS — K625 Hemorrhage of anus and rectum: Secondary | ICD-10-CM | POA: Diagnosis not present

## 2015-11-11 DIAGNOSIS — L659 Nonscarring hair loss, unspecified: Secondary | ICD-10-CM | POA: Diagnosis not present

## 2015-11-11 DIAGNOSIS — E559 Vitamin D deficiency, unspecified: Secondary | ICD-10-CM | POA: Diagnosis not present

## 2015-11-11 DIAGNOSIS — N390 Urinary tract infection, site not specified: Secondary | ICD-10-CM | POA: Diagnosis not present

## 2015-11-19 DIAGNOSIS — N390 Urinary tract infection, site not specified: Secondary | ICD-10-CM | POA: Diagnosis not present

## 2015-12-13 ENCOUNTER — Other Ambulatory Visit: Payer: Self-pay | Admitting: Family Medicine

## 2016-08-24 DIAGNOSIS — R34 Anuria and oliguria: Secondary | ICD-10-CM | POA: Diagnosis not present

## 2016-11-19 ENCOUNTER — Telehealth: Payer: Self-pay | Admitting: Family Medicine

## 2016-11-19 NOTE — Telephone Encounter (Signed)
Pt moved to [place]. ° °Removed Dr. Bedsole as PCP in Epic °

## 2016-12-07 DIAGNOSIS — R0602 Shortness of breath: Secondary | ICD-10-CM | POA: Diagnosis not present

## 2016-12-07 DIAGNOSIS — E039 Hypothyroidism, unspecified: Secondary | ICD-10-CM | POA: Diagnosis not present

## 2016-12-08 DIAGNOSIS — R5383 Other fatigue: Secondary | ICD-10-CM | POA: Diagnosis not present

## 2016-12-08 DIAGNOSIS — E039 Hypothyroidism, unspecified: Secondary | ICD-10-CM | POA: Diagnosis not present

## 2016-12-08 DIAGNOSIS — E669 Obesity, unspecified: Secondary | ICD-10-CM | POA: Diagnosis not present

## 2017-01-02 IMAGING — CT CT HEAD W/O CM
1 series · 16 of 30 positions shown, 20 images · non-contrast
Comparison: None.

CLINICAL DATA: Diffuse facial numbness for the past 3 days.

EXAM:
CT HEAD WITHOUT CONTRAST
TECHNIQUE: Contiguous axial images were obtained from the base of the skull
through the vertex without intravenous contrast.

[Series 2: head wo · axial · 0.39mm/px · z∈[+524,+650]mm · 16 of 32 slices shown, 20 images]
[im 2/32  brain]
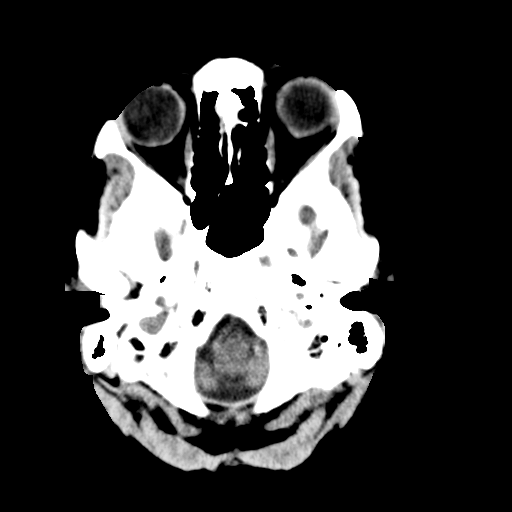
[im 2/32  bone]
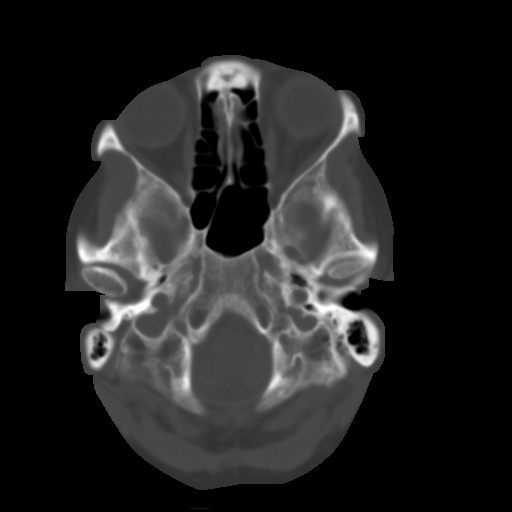
[im 4/32  brain]
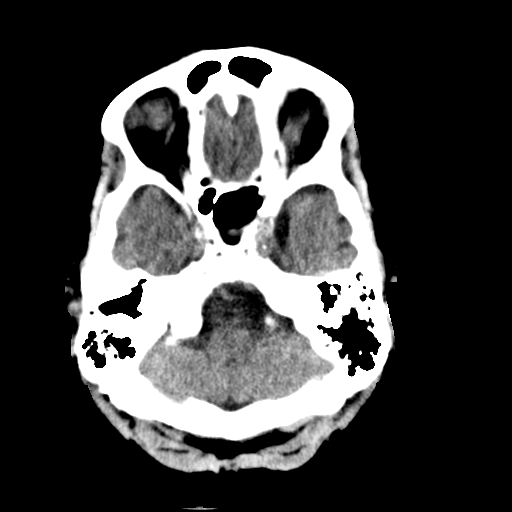
[im 6/32  brain]
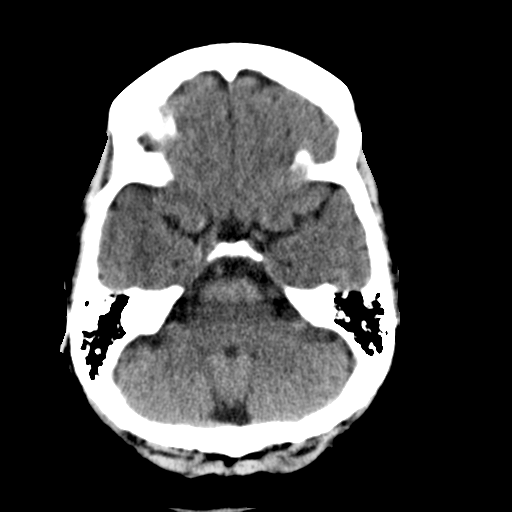
[im 8/32  brain]
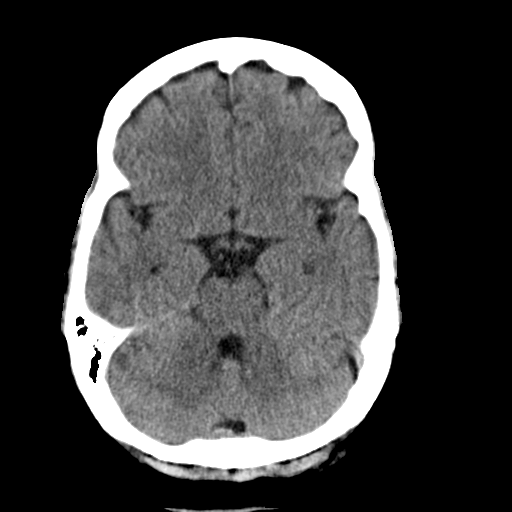
[im 9/32  brain]
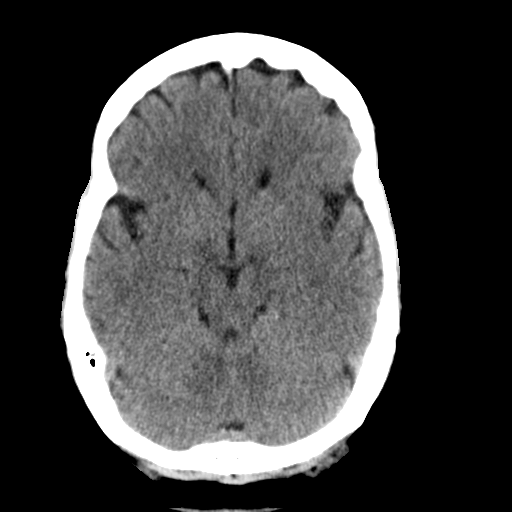
[im 9/32  bone]
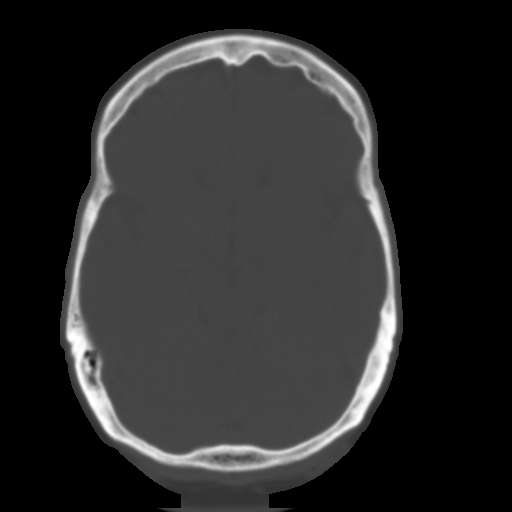
[im 11/32  brain]
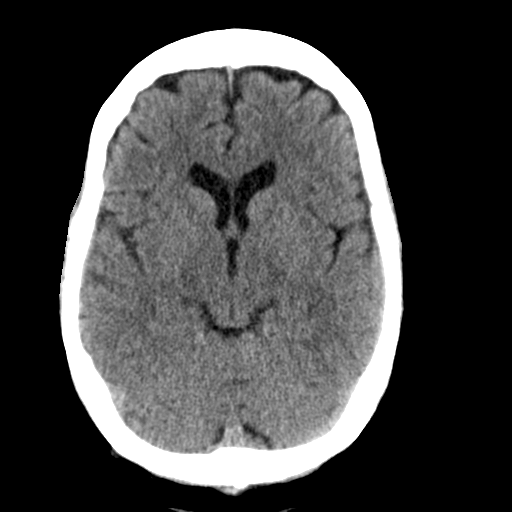
[im 13/32  brain]
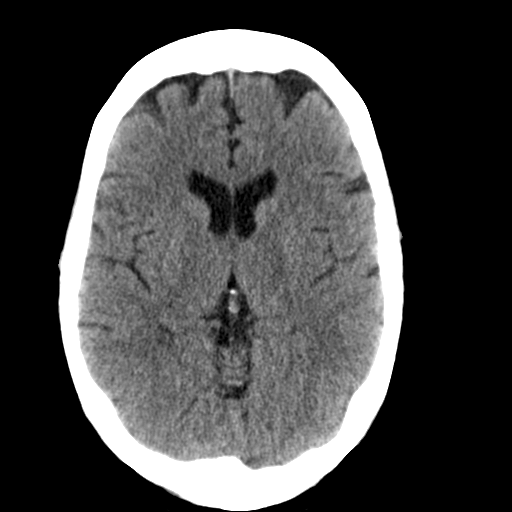
[im 15/32  brain]
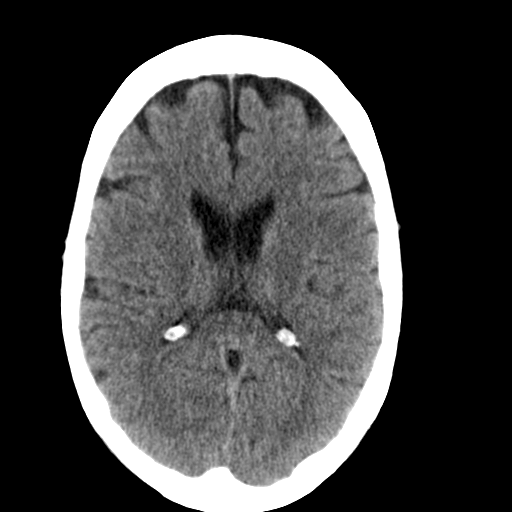
[im 17/32  brain]
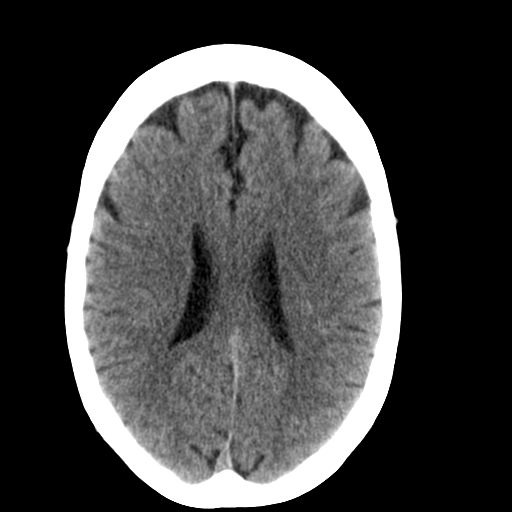
[im 17/32  bone]
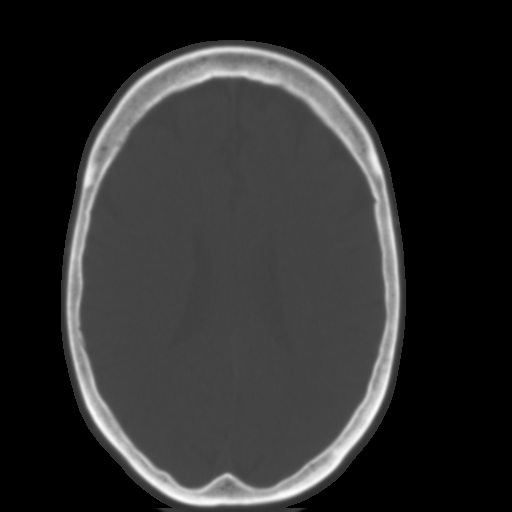
[im 19/32  brain]
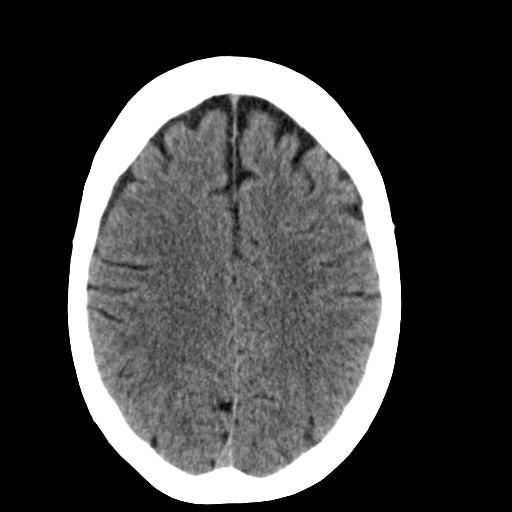
[im 21/32  brain]
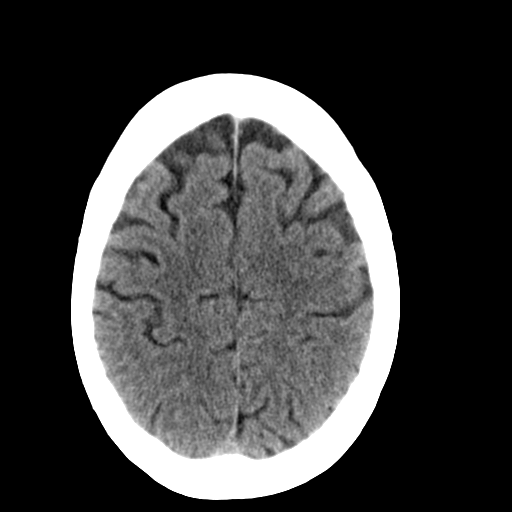
[im 23/32  brain]
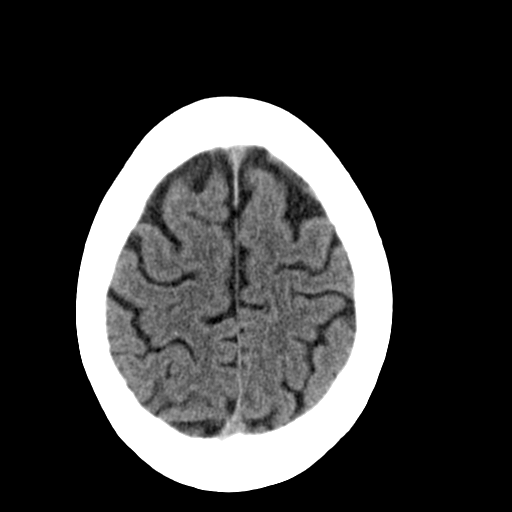
[im 24/32  brain]
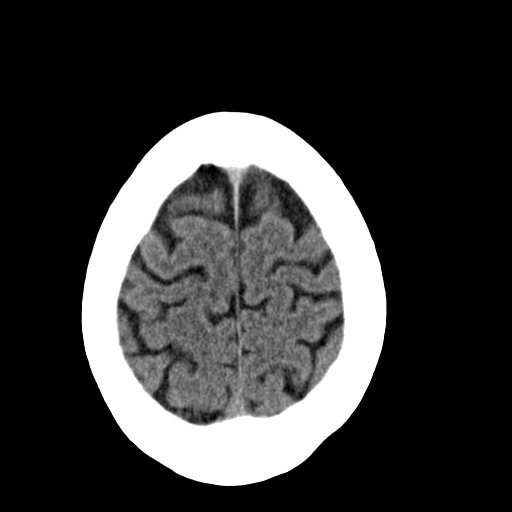
[im 24/32  bone]
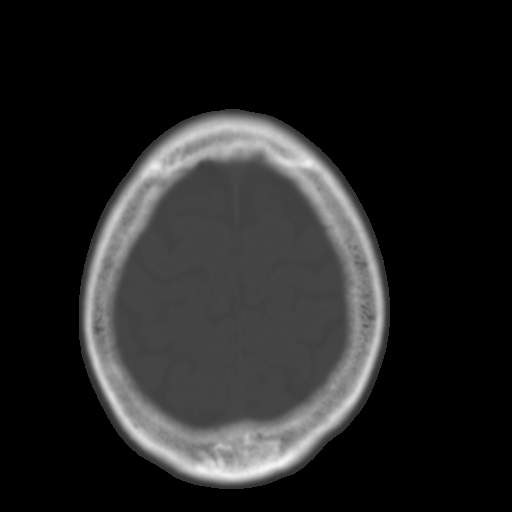
[im 26/32  brain]
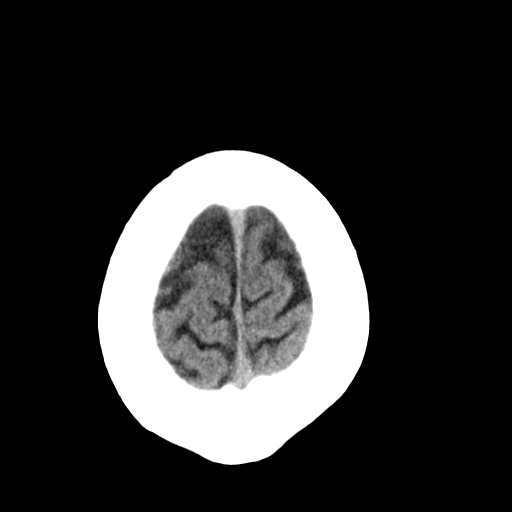
[im 28/32  brain]
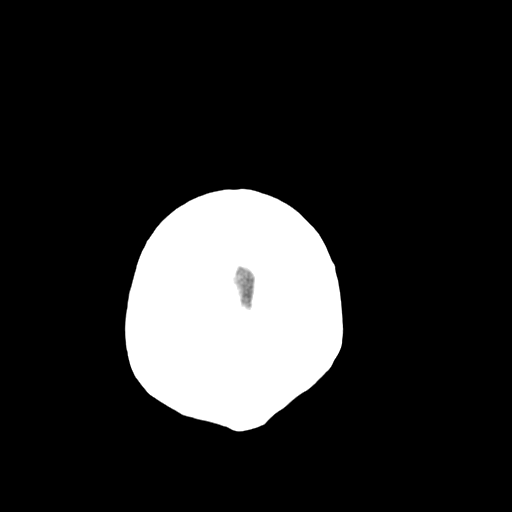
[im 30/32  brain]
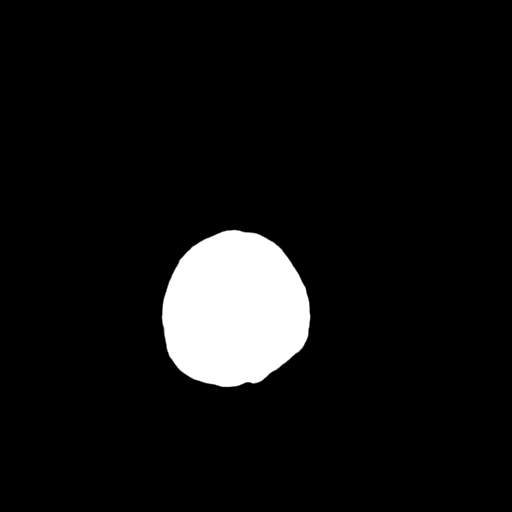

[16 of 30 positions shown; findings below may reference images not displayed]

FINDINGS: Diffusely enlarged ventricles and subarachnoid spaces. No
intracranial hemorrhage, mass lesion or CT evidence of acute
infarction. Unremarkable bones and included paranasal sinuses.
IMPRESSION: No acute abnormality.  Mild diffuse cerebral and cerebellar atrophy.

## 2017-04-04 DIAGNOSIS — I2510 Atherosclerotic heart disease of native coronary artery without angina pectoris: Secondary | ICD-10-CM | POA: Diagnosis not present

## 2017-08-24 ENCOUNTER — Encounter (HOSPITAL_COMMUNITY): Payer: Self-pay | Admitting: Emergency Medicine

## 2017-08-24 ENCOUNTER — Other Ambulatory Visit: Payer: Self-pay

## 2017-08-24 ENCOUNTER — Emergency Department (HOSPITAL_COMMUNITY)
Admission: EM | Admit: 2017-08-24 | Discharge: 2017-08-24 | Disposition: A | Discharge status: Home / Self Care | Payer: Medicare Other | Attending: Emergency Medicine | Admitting: Emergency Medicine

## 2017-08-24 DIAGNOSIS — L02211 Cutaneous abscess of abdominal wall: Secondary | ICD-10-CM | POA: Diagnosis not present

## 2017-08-24 DIAGNOSIS — Z79899 Other long term (current) drug therapy: Secondary | ICD-10-CM | POA: Insufficient documentation

## 2017-08-24 DIAGNOSIS — E039 Hypothyroidism, unspecified: Secondary | ICD-10-CM | POA: Diagnosis not present

## 2017-08-24 LAB — I-STAT CG4 LACTIC ACID, ED: LACTIC ACID, VENOUS: 0.76 mmol/L (ref 0.5–1.9)

## 2017-08-24 MED ORDER — SULFAMETHOXAZOLE-TRIMETHOPRIM 800-160 MG PO TABS: 1.0000 | ORAL_TABLET | Freq: Two times a day (BID) | ORAL | 0 refills | Status: AC

## 2017-08-24 MED ORDER — ACETAMINOPHEN 500 MG PO TABS: 500.0000 mg | ORAL_TABLET | Freq: Four times a day (QID) | ORAL | 0 refills | Status: AC | PRN

## 2017-08-24 MED ORDER — LIDOCAINE-EPINEPHRINE 2 %-1:200000 IJ SOLN
10.0000 mL | Freq: Once | INTRAMUSCULAR | Status: AC
Start: 2017-08-24 — End: 2017-08-24
  Administered 2017-08-24: 10 mL via INTRADERMAL
  Filled 2017-08-24: qty 20

## 2017-08-24 NOTE — ED Notes (Signed)
Pt verbalized understanding discharge instructions and denies any further needs or questions at this time. VS stable, ambulatory and steady gait.   

## 2017-08-24 NOTE — Discharge Instructions (Addendum)
Please apply warm moist compress to affected area 3-5 times daily for about 10-15 minutes to help encourage drainage and aid with wound healing.  Take antibiotic as prescribed.  Follow up in 2 days for packing removal and reassessment of wound.  Return if you have any concerns.

## 2017-08-24 NOTE — ED Provider Notes (Signed)
[facility] EMERGENCY DEPARTMENT Provider Note   CSN: [id number] Arrival date & time: 08/24/17  1001     History   Chief Complaint Chief Complaint  Patient presents with  . Abscess    HPI Jody White is a 67 y.o. female.  HPI   67 year old female with history of obesity, anxiety, thyroid disease presenting for evaluation of a wound on her abdominal wall.  Patient report 5 days ago she noticed a small bump on her right lower abdominal wall which has since increasing size, became red, tender, ulcerated and oozing out purulent discharge.  Pain is sharp, throbbing, moderate, non-radiating.  There is no associated fever, chills, nausea vomiting, numbness or weakness.  Is up-to-date with immunization.  She is currently traveling and does carry prophylactic antibiotic with her.  She tries taking amoxicillin twice daily for the past 2 days with minimal improvement. Denies recurrent abscess in the past.  No hx of DM.   Past Medical History:  Diagnosis Date  . Anxiety   . B12 deficiency 11/09/2011  . Chronic fatigue syndrome 11/09/2011  . H/O gastric bypass 11/09/2011  . Hypothyroid 11/09/2011  . 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 Obesity   . Panic disorder without agoraphobia 11/09/2011  . Persistent disorder of initiating or maintaining sleep 11/09/2011  . Vasovagal syncope 11/09/2011   [facility] [place]. Confirmed with [facility] testing in [place]   . Vitamin D deficiency 11/09/2011    Patient Active Problem List   Diagnosis Date Noted  . Breast mass, left 02/28/2015  . Vasovagal syncope, recurrent with emotional stress 02/28/2015  . Iron deficiency anemia 01/24/2012  . B12 deficiency 11/09/2011  . Vitamin D deficiency 11/09/2011  . Chronic fatigue syndrome 11/09/2011  . H/O gastric bypass 11/09/2011  . Hypothyroid 11/09/2011  . Chronic  insomnia 11/09/2011    Past Surgical History:  Procedure Laterality Date  . BREAST SURGERY    . CHOLECYSTECTOMY    . GASTRIC BYPASS  2002  . TUBAL LIGATION       OB History   None      Home Medications    Prior to Admission medications   Medication Sig Start Date End Date Taking? Authorizing Provider  dicyclomine (BENTYL) 20 MG tablet Take 1-2 tablets (20-40 mg total) by mouth daily as needed for spasms. 04/29/15   Excell SeltzerBedsole, Amy E, MD  SYNTHROID 125 MCG tablet TAKE ONE TABLET BY MOUTH DAILY 12/14/15   Excell SeltzerBedsole, Amy E, MD    Family History Family History  Problem Relation Age of Onset  . Alcohol abuse Father     Social History Social History   Tobacco Use  . Smoking status: Never Smoker  . Smokeless tobacco: Never Used  Substance Use Topics  . Alcohol use: Yes    Alcohol/week: 0.0 oz    Comment: wine occassionally  . Drug use: No     Allergies   Patient has no known allergies.   Review of Systems Review of Systems  Constitutional: Negative for fever.  Gastrointestinal: Positive for abdominal pain.  Skin: Positive for rash and wound.  Neurological: Negative for numbness.     Physical Exam Updated Vital Signs BP 123/67 (BP Location: Right Arm)   Pulse 76   Temp 98 F (36.7 C) (Oral)   Resp 16   SpO2 99%   Physical Exam  Constitutional: She appears well-developed and well-nourished. No distress.  HENT:  Head: Atraumatic.  Eyes: Conjunctivae are normal.  Neck: Neck supple.  Abdominal: Soft. Bowel sounds are normal. She exhibits no distension. There is no tenderness.  Neurological: She is alert.  Skin:  Right lower abdominal wall near the belt line is a 2 cm ulceration with macerated skin, and surrounding skin erythema along the belt line approximately 5 x 3 cm in diameter.  It is mildly indurated without fluctuance.  It is tender to the touch.  Psychiatric: She has a normal mood and affect.  Nursing note and vitals reviewed.    ED Treatments /  Results  Labs (all labs ordered are listed, but only abnormal results are displayed) Labs Reviewed  I-STAT CG4 LACTIC ACID, ED    EKG None  Radiology No results found.  Procedures .Marland KitchenIncision and Drainage Date/Time: 08/24/2017 11:03 AM Performed by: Fayrene Helper, PA-C Authorized by: Fayrene Helper, PA-C   Consent:    Consent obtained:  Verbal   Consent given by:  Patient   Risks discussed:  Incomplete drainage, damage to other organs and pain   Alternatives discussed:  No treatment Location:    Type:  Abscess   Size:  3x2   Location:  Trunk   Trunk location:  Abdomen (right lower abdominal wall) Pre-procedure details:    Skin preparation:  Betadine Anesthesia (see MAR for exact dosages):    Anesthesia method:  Local infiltration   Local anesthetic:  Lidocaine 2% WITH epi Procedure type:    Complexity:  Simple Procedure details:    Incision types:  Stab incision   Incision depth:  Subcutaneous   Scalpel blade:  11   Wound management:  Probed and deloculated   Drainage:  Purulent   Drainage amount:  Moderate   Wound treatment:  Wound left open   Packing materials:  1/4 in iodoform gauze   Amount 1/4" iodoform:  4 Post-procedure details:    Patient tolerance of procedure:  Tolerated well, no immediate complications   (including critical care time)  Medications Ordered in ED Medications  lidocaine-EPINEPHrine (XYLOCAINE W/EPI) 2 %-1:200000 (PF) injection 10 mL (has no administration in time range)     Initial Impression / Assessment and Plan / ED Course  I have reviewed the triage vital signs and the nursing notes.  Pertinent labs & imaging results that were available during my care of the patient were reviewed by me and considered in my medical decision making (see chart for details).     BP 123/67 (BP Location: Right Arm)   Pulse 76   Temp 98 F (36.7 C) (Oral)   Resp 16   SpO2 99%    Final Clinical Impressions(s) / ED Diagnoses   Final diagnoses:    None    ED Discharge Orders    None     10:29 AM Patient here with a skin ulceration to the right lower abdominal wall near the belt line with surrounding skin erythema concerning for potential abscess with surrounding cellulitis.  This is likely confined to the abdominal wall and unlikely to penetrate the peritoneum.  No systemic manifestation.  She is up-to-date with tetanus.  Will perform incision and drainage.  11:04 AM Successful I&D, packing placed.  Recommend warm compress several times daily and to f/u in 2 days for wound reassessment and packing removal.  Recommend continue taking amox and I will also add bactrim to cover for MRSA.   Fayrene Helper, PA-C 08/24/17 1130    Eber Hong, MD 08/25/17 0830

## 2017-08-24 NOTE — ED Notes (Signed)
Pt undressed and lido at bedside. PA informed

## 2017-08-24 NOTE — ED Triage Notes (Signed)
Patient has 3 cm wound on right lower abdomen that she says started a little over one week ago. Patient states she has been taking 500mg  amoxicillin but wound is getting larger.

## 2017-08-24 NOTE — ED Notes (Signed)
Dressing applied. 

## 2017-09-14 DIAGNOSIS — L089 Local infection of the skin and subcutaneous tissue, unspecified: Secondary | ICD-10-CM | POA: Diagnosis not present

## 2022-02-19 ENCOUNTER — Telehealth: Payer: Self-pay | Admitting: General Practice

## 2022-02-19 ENCOUNTER — Ambulatory Visit
Admission: EM | Admit: 2022-02-19 | Discharge: 2022-02-19 | Disposition: A | Discharge status: Home / Self Care | Payer: Medicare PPO

## 2022-02-19 ENCOUNTER — Encounter: Payer: Self-pay | Admitting: Emergency Medicine

## 2022-02-19 ENCOUNTER — Other Ambulatory Visit: Payer: Self-pay

## 2022-02-19 ENCOUNTER — Emergency Department
Admission: EM | Admit: 2022-02-19 | Discharge: 2022-02-19 | Disposition: A | Discharge status: Home / Self Care | Payer: Medicare PPO | Attending: Emergency Medicine | Admitting: Emergency Medicine

## 2022-02-19 DIAGNOSIS — M545 Low back pain, unspecified: Secondary | ICD-10-CM | POA: Insufficient documentation

## 2022-02-19 DIAGNOSIS — R300 Dysuria: Secondary | ICD-10-CM | POA: Insufficient documentation

## 2022-02-19 LAB — BASIC METABOLIC PANEL
Anion gap: 8 (ref 5–15)
BUN: 11 mg/dL (ref 8–23)
CO2: 28 mmol/L (ref 22–32)
Calcium: 9.6 mg/dL (ref 8.9–10.3)
Chloride: 104 mmol/L (ref 98–111)
Creatinine, Ser: 0.67 mg/dL (ref 0.44–1.00)
GFR, Estimated: >60 mL/min (ref >60)
Glucose, Bld: 118 mg/dL — ABNORMAL HIGH (ref 70–99)
Potassium: 4.5 mmol/L (ref 3.5–5.1)
Sodium: 140 mmol/L (ref 135–145)

## 2022-02-19 LAB — URINALYSIS, ROUTINE W REFLEX MICROSCOPIC
Bilirubin Urine: NEGATIVE
Glucose, UA: NEGATIVE mg/dL
Hgb urine dipstick: NEGATIVE
Ketones, ur: NEGATIVE mg/dL
Leukocytes,Ua: NEGATIVE
Nitrite: NEGATIVE
Protein, ur: NEGATIVE mg/dL
Specific Gravity, Urine: 1.004 — ABNORMAL LOW (ref 1.005–1.030)
pH: 7 (ref 5.0–8.0)

## 2022-02-19 LAB — CBC
HCT: 38.6 % (ref 36.0–46.0)
Hemoglobin: 12.1 g/dL (ref 12.0–15.0)
MCH: 27.4 pg (ref 26.0–34.0)
MCHC: 31.3 g/dL (ref 30.0–36.0)
MCV: 87.5 fL (ref 80.0–100.0)
Platelets: 272 10*3/uL (ref 150–400)
RBC: 4.41 MIL/uL (ref 3.87–5.11)
RDW: 14.1 % (ref 11.5–15.5)
WBC: 3.7 10*3/uL — ABNORMAL LOW (ref 4.0–10.5)
nRBC: 0 % (ref 0.0–0.2)

## 2022-02-19 MED ORDER — CYCLOBENZAPRINE HCL 10 MG PO TABS: 10.0000 mg | ORAL_TABLET | Freq: Three times a day (TID) | ORAL | 0 refills | Status: DC | PRN

## 2022-02-19 MED ORDER — ZOLPIDEM TARTRATE ER 12.5 MG PO TBCR: 12.5000 mg | EXTENDED_RELEASE_TABLET | Freq: Every evening | ORAL | 1 refills | Status: DC | PRN

## 2022-02-19 MED ORDER — NAPROXEN 500 MG PO TABS: 500.0000 mg | ORAL_TABLET | Freq: Two times a day (BID) | ORAL | 2 refills | Status: DC

## 2022-02-19 MED ORDER — LEVOTHYROXINE SODIUM 125 MCG PO TABS: 125.0000 ug | ORAL_TABLET | Freq: Every day | ORAL | 2 refills | Status: DC

## 2022-02-19 NOTE — ED Triage Notes (Signed)
Patient arrives ambulatory by POV with husband. Patient c/o lower back/ pubis area ongoing for a while. Patient reports having issues with her reproductive system when she was in [place]. Patient concerned for UTI due to difficulty voiding. C/o feeling dehydrated due to dry mouth. Also concerned something was coming from her ear last night.

## 2022-02-19 NOTE — Telephone Encounter (Signed)
It is okay to make them a reestablish care office visit and okay to make in t 3 weeks on a day there are no other transfers of care or new patients if possible. Thanks

## 2022-02-19 NOTE — Telephone Encounter (Signed)
Patient came in to the office and she and her husband recently moved back to the area from [place] and both would like to see Dr. Ermalene Searing. I informed her that Dr. Ermalene Searing was booked out until June for new patients. Can they re-establish care with Dr. Ermalene Searing and get a sooner appointment or they will need a new patient appointment and wait until June? They are needing medications refilled and will be out of blood pressure medication in about 3 weeks. Please advise. Thank you!

## 2022-02-19 NOTE — ED Provider Notes (Signed)
[facility] Provider Note    Event Date/Time   First MD Initiated Contact with Patient 02/19/22 1323     (approximate)   History   Back pain   HPI  Jody White is a 71 y.o. female who presents with complaints of low back pain.  Patient reports she is been having this pain chronically for quite some time, she reports it is in the sacral area.  No neurodeficits.  She denies dysuria to me but reports she has had UTIs in the past.  She has imaging results performed in [place] demonstrate arthritis of the sacrum/lumbar spine     Physical Exam   Triage Vital Signs: ED Triage Vitals  Enc Vitals Group     BP 02/19/22 1307 129/61     Pulse Rate 02/19/22 1302 89     Resp 02/19/22 1302 17     Temp 02/19/22 1308 (!) 97.1 F (36.2 C)     Temp src --      SpO2 02/19/22 1302 97 %     Weight 02/19/22 1306 86.2 kg (190 lb)     Height 02/19/22 1306 1.689 m (5' 6.5")     Head Circumference --      Peak Flow --      Pain Score 02/19/22 1351 2     Pain Loc --      Pain Edu? --      Excl. in [place]? --     Most recent vital signs: Vitals:   02/19/22 1307 02/19/22 1308  BP: 129/61   Pulse:    Resp:    Temp:  (!) 97.1 F (36.2 C)  SpO2:       General: Awake, no distress.  CV:  Good peripheral perfusion.  Resp:  Normal effort.  Abd:  No distention.  Other:  Normal strength in the lower extremities, ambulating well, no saddle anesthesia, no vertebral tenderness to palpation   ED Results / Procedures / Treatments   Labs (all labs ordered are listed, but only abnormal results are displayed) Labs Reviewed  URINALYSIS, ROUTINE W REFLEX MICROSCOPIC - Abnormal; Notable for the following components:      Result Value   Color, Urine STRAW (*)    APPearance CLEAR (*)    Specific Gravity, Urine 1.004 (*)    All other components within normal limits  BASIC METABOLIC PANEL - Abnormal; Notable for the following components:   Glucose, Bld 118 (*)    All  other components within normal limits  CBC - Abnormal; Notable for the following components:   WBC 3.7 (*)    All other components within normal limits     EKG     RADIOLOGY     PROCEDURES:  Critical Care performed:   Procedures   MEDICATIONS ORDERED IN ED: Medications - No data to display   IMPRESSION / MDM / [place] / ED COURSE  I reviewed the triage vital signs and the nursing notes. Patient's presentation is most consistent with exacerbation of chronic illness.  Patient has chronic low back pain including fusion of the lumbar spine in the past.  She reports since then she has had pain in the sacrum, here today also for medication refill.  No indication for further imaging at this time, exam is quite reassuring, appropriate for discharge with outpatient follow-up        FINAL CLINICAL IMPRESSION(S) / ED DIAGNOSES   Final diagnoses:  Acute midline low back pain without  sciatica     Rx / DC Orders   ED Discharge Orders          Ordered    naproxen (NAPROSYN) 500 MG tablet  2 times daily with meals        02/19/22 1343    zolpidem (AMBIEN CR) 12.5 MG CR tablet  At bedtime PRN        02/19/22 1343    cyclobenzaprine (FLEXERIL) 10 MG tablet  3 times daily PRN        02/19/22 1343    levothyroxine (SYNTHROID) 125 MCG tablet  Daily        02/19/22 1343             Note:  This document was prepared using Dragon voice recognition software and may include unintentional dictation errors.   Jene Every, MD 02/19/22 [phone]

## 2022-02-19 NOTE — Telephone Encounter (Signed)
Patients are scheduled for January. They went to the ED today and was able to get refills to hold them over. Thank you!

## 2022-04-01 ENCOUNTER — Encounter: Payer: Self-pay | Admitting: Family Medicine

## 2022-04-01 ENCOUNTER — Ambulatory Visit (INDEPENDENT_AMBULATORY_CARE_PROVIDER_SITE_OTHER): Payer: Medicare PPO | Admitting: Family Medicine

## 2022-04-01 VITALS — BP 134/72 | HR 82 | Temp 97.3°F | Resp 16 | Ht 66.5 in | Wt 172.1 lb

## 2022-04-01 DIAGNOSIS — M5416 Radiculopathy, lumbar region: Secondary | ICD-10-CM | POA: Insufficient documentation

## 2022-04-01 DIAGNOSIS — F5104 Psychophysiologic insomnia: Secondary | ICD-10-CM

## 2022-04-01 DIAGNOSIS — G8929 Other chronic pain: Secondary | ICD-10-CM

## 2022-04-01 DIAGNOSIS — I700 Atherosclerosis of aorta: Secondary | ICD-10-CM

## 2022-04-01 DIAGNOSIS — K4090 Unilateral inguinal hernia, without obstruction or gangrene, not specified as recurrent: Secondary | ICD-10-CM

## 2022-04-01 DIAGNOSIS — E038 Other specified hypothyroidism: Secondary | ICD-10-CM

## 2022-04-01 DIAGNOSIS — E538 Deficiency of other specified B group vitamins: Secondary | ICD-10-CM

## 2022-04-01 DIAGNOSIS — G9332 Myalgic encephalomyelitis/chronic fatigue syndrome: Secondary | ICD-10-CM

## 2022-04-01 DIAGNOSIS — M545 Low back pain, unspecified: Secondary | ICD-10-CM | POA: Insufficient documentation

## 2022-04-01 DIAGNOSIS — Z87440 Personal history of urinary (tract) infections: Secondary | ICD-10-CM | POA: Insufficient documentation

## 2022-04-01 DIAGNOSIS — K5790 Diverticulosis of intestine, part unspecified, without perforation or abscess without bleeding: Secondary | ICD-10-CM | POA: Diagnosis not present

## 2022-04-01 DIAGNOSIS — M5442 Lumbago with sciatica, left side: Secondary | ICD-10-CM

## 2022-04-01 DIAGNOSIS — M8580 Other specified disorders of bone density and structure, unspecified site: Secondary | ICD-10-CM

## 2022-04-01 DIAGNOSIS — M5441 Lumbago with sciatica, right side: Secondary | ICD-10-CM

## 2022-04-01 MED ORDER — ZOLPIDEM TARTRATE 10 MG PO TABS: 10.0000 mg | ORAL_TABLET | Freq: Every evening | ORAL | 0 refills | Status: DC | PRN

## 2022-04-01 MED ORDER — CYANOCOBALAMIN 1000 MCG/ML IJ SOLN: 1000.0000 ug | INTRAMUSCULAR | 3 refills | Status: DC

## 2022-04-01 NOTE — Assessment & Plan Note (Signed)
Noted on MRI 2023

## 2022-04-01 NOTE — Assessment & Plan Note (Signed)
Giving monthly B12 injections at home.

## 2022-04-01 NOTE — Assessment & Plan Note (Signed)
Noted on MRI 2023.. no pain, asymptomatic, contains fat.

## 2022-04-01 NOTE — Addendum Note (Signed)
Addended byEliezer Lofts E on: 04/01/2022 02:40 PM   Modules accepted: Orders

## 2022-04-01 NOTE — Assessment & Plan Note (Signed)
Stable, chronic.  Continue current medication.  Ambien CR

## 2022-04-01 NOTE — Assessment & Plan Note (Signed)
Stable, chronic.  Continue current medication.   Levo 125 mcg daily 

## 2022-04-01 NOTE — Patient Instructions (Addendum)
Start home  gentle physical therapy.  Use tylenol for pain.   Work on [employer], regular exercise

## 2022-04-01 NOTE — Assessment & Plan Note (Signed)
History of laminectomy in 2022.  Moderate control of pain  with tylenol.  Not interested in steroid injection.   Start home PT.

## 2022-04-01 NOTE — Progress Notes (Addendum)
Patient ID: Jody White, female    DOB: [birth year], 72 y.o.   MRN: [id number]  This visit was conducted in person.  BP 134/72   Pulse 82   Temp (!) 97.3 F (36.3 C)   Resp 16   Ht 5' 6.5" (1.689 m)   Wt 172 lb 2 oz (78.1 kg)   SpO2 94%   BMI 27.37 kg/m    CC:  Chief Complaint  Patient presents with   Establish Care    Subjective:   HPI: Jody White is a 72 y.o. female presenting on 04/01/2022 for [place] here  2016 Moved to [place] and then [place]  Recent ER visit for acute lumbar back pain on 02/19/2022, reviewed in detail.  Had episode of gastritis in 2022..Saw GI  and had EGD and colonoscopy.  Did also have UTI that resulted in hospitalization in 2022.   ON plain X-ray.. showed issue with L4.. treated with flexeril and PT. Had MRI lumbar spine:  L4/L5 abnormal, ? Herniated disc Had 04/2021 laminectomy. Pain  was worsening in low back... constant low back pain and right buttock pain    MRI brain and abd pelvis from 08/2021.. reviewed spanish readings with patient.  Noted diverticulosis, osteopenia, Aortic atherosclerosis, inguinal hernia,   Tylenol now helping with low back pain. No Home PT.   Hypothyroid; last check /2024.[facility] Kitchen TSH 0.68 Lab Results  Component Value Date   TSH 1.540 01/26/2015   History of iron deficiency/Vit D def: reviewed labs from [place]..nml 10/2021.   Chronic insomnia and chronic fatigue syndrome:  fatigue per  EBV.  On Ambien for sleep.. need med covered by insurance.    Relevant past medical, surgical, family and social history reviewed and updated as indicated. Interim medical history since our last visit reviewed. Allergies and medications reviewed and updated. Outpatient Medications Prior to Visit  Medication Sig Dispense Refill   levothyroxine (SYNTHROID) 125 MCG tablet Take 1 tablet (125 mcg total) by mouth daily. 30 tablet 2   zolpidem (AMBIEN CR) 12.5 MG CR tablet Take 1 tablet (12.5 mg total) by  mouth at bedtime as needed for sleep. 30 tablet 1   acetaminophen (TYLENOL) 500 MG tablet Take 1 tablet (500 mg total) by mouth every 6 (six) hours as needed. (Patient not taking: Reported on 04/01/2022) 30 tablet 0   cyclobenzaprine (FLEXERIL) 10 MG tablet Take 1 tablet (10 mg total) by mouth 3 (three) times daily as needed for muscle spasms. (Patient not taking: Reported on 04/01/2022) 30 tablet 0   dicyclomine (BENTYL) 20 MG tablet Take 1-2 tablets (20-40 mg total) by mouth daily as needed for spasms. (Patient not taking: Reported on 04/01/2022) 60 tablet 2   naproxen (NAPROSYN) 500 MG tablet Take 1 tablet (500 mg total) by mouth 2 (two) times daily with a meal. (Patient not taking: Reported on 04/01/2022) 20 tablet 2   No facility-administered medications prior to visit.     Per HPI unless specifically indicated in ROS section below Review of Systems  Constitutional:  Negative for fatigue and fever.  HENT:  Negative for congestion.   Eyes:  Negative for pain.  Respiratory:  Negative for cough and shortness of breath.   Cardiovascular:  Negative for chest pain, palpitations and leg swelling.  Gastrointestinal:  Negative for abdominal pain.  Genitourinary:  Negative for dysuria and vaginal bleeding.  Musculoskeletal:  Negative for back pain.  Neurological:  Negative for syncope, light-headedness and headaches.  Psychiatric/Behavioral:  Negative  for dysphoric mood.    Objective:  BP 134/72   Pulse 82   Temp (!) 97.3 F (36.3 C)   Resp 16   Ht 5' 6.5" (1.689 m)   Wt 172 lb 2 oz (78.1 kg)   SpO2 94%   BMI 27.37 kg/m   Wt Readings from Last 3 Encounters:  04/01/22 172 lb 2 oz (78.1 kg)  02/19/22 190 lb (86.2 kg)  02/28/15 203 lb (92.1 kg)      Physical Exam Vitals and nursing note reviewed.  Constitutional:      General: She is not in acute distress.    Appearance: Normal appearance. She is well-developed. She is not ill-appearing or toxic-appearing.  HENT:     Head:  Normocephalic.     Right Ear: Hearing, tympanic membrane, ear canal and external ear normal.     Left Ear: Hearing, tympanic membrane, ear canal and external ear normal.     Nose: Nose normal.  Eyes:     General: Lids are normal. Lids are everted, no foreign bodies appreciated.     Conjunctiva/sclera: Conjunctivae normal.     Pupils: Pupils are equal, round, and reactive to light.  Neck:     Thyroid: No thyroid mass or thyromegaly.     Vascular: No carotid bruit.     Trachea: Trachea normal.  Cardiovascular:     Rate and Rhythm: Normal rate and regular rhythm.     Heart sounds: Normal heart sounds, S1 normal and S2 normal. No murmur heard.    No gallop.  Pulmonary:     Effort: Pulmonary effort is normal. No respiratory distress.     Breath sounds: Normal breath sounds. No wheezing, rhonchi or rales.  Abdominal:     General: Bowel sounds are normal. There is no distension or abdominal bruit.     Palpations: Abdomen is soft. There is no fluid wave or mass.     Tenderness: There is no abdominal tenderness. There is no guarding or rebound.     Hernia: No hernia is present.  Musculoskeletal:     Cervical back: Normal range of motion and neck supple.  Lymphadenopathy:     Cervical: No cervical adenopathy.  Skin:    General: Skin is warm and dry.     Findings: No rash.  Neurological:     Mental Status: She is alert.     Cranial Nerves: No cranial nerve deficit.     Sensory: No sensory deficit.  Psychiatric:        Mood and Affect: Mood is not anxious or depressed.        Speech: Speech normal.        Behavior: Behavior normal. Behavior is cooperative.        Judgment: Judgment normal.       Results for orders placed or performed during the hospital encounter of 02/19/22  Urinalysis, Routine w reflex microscopic Urine, Clean Catch  Result Value Ref Range   Color, Urine STRAW (A) YELLOW   APPearance CLEAR (A) CLEAR   Specific Gravity, Urine 1.004 (L) 1.005 - 1.030   pH 7.0 5.0  - 8.0   Glucose, UA NEGATIVE NEGATIVE mg/dL   Hgb urine dipstick NEGATIVE NEGATIVE   Bilirubin Urine NEGATIVE NEGATIVE   Ketones, ur NEGATIVE NEGATIVE mg/dL   Protein, ur NEGATIVE NEGATIVE mg/dL   Nitrite NEGATIVE NEGATIVE   Leukocytes,Ua NEGATIVE NEGATIVE  Basic metabolic panel  Result Value Ref Range   Sodium 140 135 - 145 mmol/L  Potassium 4.5 3.5 - 5.1 mmol/L   Chloride 104 98 - 111 mmol/L   CO2 28 22 - 32 mmol/L   Glucose, Bld 118 (H) 70 - 99 mg/dL   BUN 11 8 - 23 mg/dL   Creatinine, Ser 0.67 0.44 - 1.00 mg/dL   Calcium 9.6 8.9 - 10.3 mg/dL   GFR, Estimated >60 >60 mL/min   Anion gap 8 5 - 15  CBC  Result Value Ref Range   WBC 3.7 (L) 4.0 - 10.5 K/uL   RBC 4.41 3.87 - 5.11 MIL/uL   Hemoglobin 12.1 12.0 - 15.0 g/dL   HCT 38.6 36.0 - 46.0 %   MCV 87.5 80.0 - 100.0 fL   MCH 27.4 26.0 - 34.0 pg   MCHC 31.3 30.0 - 36.0 g/dL   RDW 14.1 11.5 - 15.5 %   Platelets 272 150 - 400 K/uL   nRBC 0.0 0.0 - 0.2 %    Assessment and Plan  Chronic bilateral low back pain with bilateral sciatica Assessment & Plan: History of laminectomy in 2022.  Moderate control of pain  with tylenol.  Not interested in steroid injection.   Start home PT.  Orders: -     Ambulatory referral to Physical Therapy  Osteopenia, unspecified location Assessment & Plan: Noted on 2023 MRI.   Diverticulosis  Aortic atherosclerosis [facility]) Assessment & Plan: Noted on MRI 2023   Hernia, inguinal, right Assessment & Plan:  Noted on MRI 2023.. no pain, asymptomatic, contains fat.   History of recurrent UTI (urinary tract infection)  Other specified hypothyroidism Assessment & Plan: Stable, chronic.  Continue current medication.  Levo  125 mcg daily   Chronic fatigue syndrome Assessment & Plan: Hx of EBV   Has flares off and on.    Chronic insomnia Assessment & Plan: Stable, chronic.  Continue current medication.  Ambien CR    B12 deficiency Assessment & Plan: Giving monthly  B12 injections at home.   Other orders -     Zolpidem Tartrate; Take 1 tablet (10 mg total) by mouth at bedtime as needed for sleep.  Dispense: 30 tablet; Refill: 0 -     Cyanocobalamin; Inject 1 mL (1,000 mcg total) into the muscle every 30 (thirty) days.  Dispense: 3 mL; Refill: 3    Return in about 7 months (around 10/31/2022) for phone AMW,  fasting labs then 64min CPE with me.   Addendum: Patient has decided to move forward with referral to formal PT for her back.  Order placed  Eliezer Lofts, MD

## 2022-04-01 NOTE — Assessment & Plan Note (Signed)
Hx of EBV   Has flares off and on.

## 2022-04-01 NOTE — Assessment & Plan Note (Signed)
Noted on 2023 MRI.

## 2022-04-29 ENCOUNTER — Telehealth: Payer: Self-pay | Admitting: Family Medicine

## 2022-04-29 MED ORDER — ZOLPIDEM TARTRATE 10 MG PO TABS: 10.0000 mg | ORAL_TABLET | Freq: Every evening | ORAL | 2 refills | Status: DC | PRN

## 2022-04-29 MED ORDER — ZOLPIDEM TARTRATE 10 MG PO TABS: 10.0000 mg | ORAL_TABLET | Freq: Every evening | ORAL | 0 refills | Status: DC | PRN

## 2022-04-29 NOTE — Addendum Note (Signed)
Addended by: Eliezer Lofts E on: 04/29/2022 05:45 PM   Modules accepted: Orders

## 2022-04-29 NOTE — Telephone Encounter (Signed)
I am willing to resend the prescription with 2 refills, but given it is a controlled substance we need to monitor closely.

## 2022-04-29 NOTE — Telephone Encounter (Signed)
Last office visit 04/01/2022 for establish care.  Last refilled 04/01/2022 for #30 with no refills.  No future appointments.

## 2022-04-29 NOTE — Telephone Encounter (Signed)
Patient received rx,and she wanted to know if there is any way possible that refills could be put on this rx to keep her from having to call every month to have it filled. And so that she can in sure that she doesn't  miss a dose

## 2022-04-29 NOTE — Telephone Encounter (Signed)
Prescription Request  04/29/2022  Is this a "Controlled Substance" medicine? No  LOV: 04/01/2022  What is the name of the medication or equipment? zolpidem (AMBIEN) 10 MG tablet   Have you contacted your pharmacy to request a refill? No   Which pharmacy would you like this sent to?  [id number]tha [facility], [place] [address] [place] [address] Phone: [phone] Fax: [phone]    Patient notified that their request is being sent to the clinical staff for review and that they should receive a response within 2 business days.   Please advise at Mobile [phone] (mobile)   Patient would like enough refills to last until her next visit. She has two more days left and will need it by Saturday. Patient would like a call when this is done. Thank you!

## 2022-04-30 NOTE — Telephone Encounter (Signed)
Left message for Mrs. Mirarchi that Dr. Diona Browner did go ahead and send in Rx with 2 refills but it is a controlled substance so we just have to monitor it closely.  I ask that she call me back if he has any questions,

## 2022-07-22 ENCOUNTER — Other Ambulatory Visit: Payer: Self-pay | Admitting: Family Medicine

## 2022-07-22 MED ORDER — LEVOTHYROXINE SODIUM 125 MCG PO TABS: 125.0000 ug | ORAL_TABLET | Freq: Every day | ORAL | 3 refills | Status: DC

## 2022-07-22 MED ORDER — ZOLPIDEM TARTRATE 10 MG PO TABS: 10.0000 mg | ORAL_TABLET | Freq: Every evening | ORAL | 2 refills | Status: DC | PRN

## 2022-07-22 NOTE — Telephone Encounter (Signed)
Prescription Request  07/22/2022  LOV: 04/01/2022  What is the name of the medication or equipment? levothyroxine (SYNTHROID) 125 MCG tablet   zolpidem (AMBIEN) 10 MG tablet patient would like 90 day supply or 3 months of refills of both medications   Have you contacted your pharmacy to request a refill? Yes   Which pharmacy would you like this sent to?  [id number]ann Sheen, [place] - [address] [place] [address] Phone: [phone] Fax: [phone]    Patient notified that their request is being sent to the clinical staff for review and that they should receive a response within 2 business days.   Please advise at Mobile [phone] (mobile)

## 2022-07-22 NOTE — Addendum Note (Signed)
Addended by: Eual Fines on: 07/22/2022 11:14 AM   Modules accepted: Orders

## 2022-08-13 ENCOUNTER — Ambulatory Visit (INDEPENDENT_AMBULATORY_CARE_PROVIDER_SITE_OTHER): Payer: Medicare PPO | Admitting: Family Medicine

## 2022-08-13 ENCOUNTER — Telehealth: Payer: Self-pay

## 2022-08-13 ENCOUNTER — Encounter: Payer: Self-pay | Admitting: Family Medicine

## 2022-08-13 VITALS — BP 102/60 | HR 74 | Temp 98.2°F | Ht 66.5 in | Wt 170.4 lb

## 2022-08-13 DIAGNOSIS — R5383 Other fatigue: Secondary | ICD-10-CM

## 2022-08-13 DIAGNOSIS — R6889 Other general symptoms and signs: Secondary | ICD-10-CM | POA: Diagnosis not present

## 2022-08-13 DIAGNOSIS — M5416 Radiculopathy, lumbar region: Secondary | ICD-10-CM

## 2022-08-13 LAB — CBC WITH DIFFERENTIAL/PLATELET
Absolute Monocytes: 320 cells/uL (ref 200–950)
Basophils Absolute: 28 cells/uL (ref 0–200)
Basophils Relative: 0.4 %
Eosinophils Absolute: 57 cells/uL (ref 15–500)
MCH: 28.9 pg (ref 27.0–33.0)
MPV: 10 fL (ref 7.5–12.5)
Monocytes Relative: 4.5 %
Neutro Abs: 5623 cells/uL (ref 1500–7800)
RDW: 12.9 % (ref 11.0–15.0)
Total Lymphocyte: 15.1 %

## 2022-08-13 MED ORDER — CYCLOBENZAPRINE HCL 10 MG PO TABS: 5.0000 mg | ORAL_TABLET | Freq: Every day | ORAL | 0 refills | Status: DC

## 2022-08-13 NOTE — Assessment & Plan Note (Signed)
Acute acute flare of chronic issue Continue using Flexeril as needed and continue home physical therapy.  Can use ibuprofen as needed for pain.

## 2022-08-13 NOTE — Addendum Note (Signed)
Addended by: Alvina Chou on: 08/13/2022 02:57 PM   Modules accepted: Orders

## 2022-08-13 NOTE — Telephone Encounter (Signed)
Pt walked in; pt reestablished care 04/01/22; pt concerned about low temp in the 90s at home check, pt has coldness in hands and feet and weakness in arms but pt walking normally. Pt has on and off sudden feeling of fatigue (last time occurred was 08/12/22). Pt said she has puffy feet and eyes,hot flashes on and off,brittle  nails and at times purple color at base of nails; pt not sure how long had purple color of nails. Weight loss, swelling and pressure at back of neck and shoulders, pain and muscle weakness in rt hip and leg on and off; brain fog, lightheaded, and "sensation in head" all the time. Pt denies H/A, CP or SOB. Pt also complains on and off about having to make herself swallow. No pain or obstruction in throat but pt said she has to concentrate to force herself to swallow. Pt also concerned about possible anemia and wants to know if thyroid could cause any of these symptoms. Pt does not look in distress at this time but I advised that Dr Ermalene Searing might not be able to address all these issues in one visit and does pt want to go to [facility] or ED. Pt said she only wants to see Dr Ermalene Searing; appt scheduled with Dr Ermalene Searing 08/13/22 at 2 PM with UC & ED precautions. Pt voiced understanding and appreciated appt. Pt said for appt today she is mostly concerned with low body temp,coldness in hands and feet and on and off sudden fatigue. Pt will be at [facility] at 1:45 to get cked in. Now T 97.9 P 68 BP 110/68 lt arm sitting regular cuff and pulse ox 98% room air. Sending note to DR Ermalene Searing who I have spoken with this morning and [facility] pool.

## 2022-08-13 NOTE — Progress Notes (Signed)
Patient ID: Jody White, female    DOB: [birth year], 72 y.o.   MRN: [id number]  This visit was conducted in person.  BP 102/60 (BP Location: Left Arm, Patient Position: Sitting, Cuff Size: Normal)   Pulse 74   Temp 98.2 F (36.8 C) (Temporal)   Ht 5' 6.5" (1.689 m)   Wt 170 lb 6 oz (77.3 kg)   SpO2 98%   BMI 27.09 kg/m    CC:  Chief Complaint  Patient presents with   Cold Extremity   Fatigue    No energy   Puffiness    In Feet and Eyes   Headache    Sharp pain in head   Tension   Hip Pain    Right that radiates down leg   Medication Refill    Request refill on Flexeril 10 mg    Subjective:   HPI: Jody White is a 72 y.o. female presenting on 08/13/2022 for Cold Extremity, Fatigue (No energy), Puffiness (In Feet and Eyes), Headache (Sharp pain in head), Tension, Hip Pain (Right that radiates down leg), and Medication Refill (Request refill on Flexeril 10 mg)   She reports in last 2 months she has noted feeling colder, less energy, more fatigue.  Hands and feet are cold.  She then noted edema in feet and around eyes.  Feels like she cannot think clearly.  Intermittent episodes on sharp head pain.  She has noted tension in neck and shoulders.    No bleeding, no nose bleeding. No blood in stool.   No depression , no anxiety.  Sleeping well at night.. no snoring, uses Ambien.   Some issues swallowing but no choking.    She has also been  using flexeril  prn for acute midline low back pain with left sciatica... reviewed ER visit in 02/2023  Using ibuprofen or aleve  Limited home PT.      Relevant past medical, surgical, family and social history reviewed and updated as indicated. Interim medical history since our last visit reviewed. Allergies and medications reviewed and updated. Outpatient Medications Prior to Visit  Medication Sig Dispense Refill   acetaminophen (TYLENOL) 500 MG tablet Take 1 tablet (500 mg total) by mouth every 6 (six) hours as  needed. 30 tablet 0   cyanocobalamin (VITAMIN B12) 1000 MCG/ML injection Inject 1 mL (1,000 mcg total) into the muscle every 30 (thirty) days. 3 mL 3   levothyroxine (SYNTHROID) 125 MCG tablet Take 1 tablet (125 mcg total) by mouth daily. 90 tablet 3   zolpidem (AMBIEN) 10 MG tablet Take 1 tablet (10 mg total) by mouth at bedtime as needed for sleep. 30 tablet 2   No facility-administered medications prior to visit.     Per HPI unless specifically indicated in ROS section below Review of Systems  Constitutional:  Positive for fatigue. Negative for fever.  HENT:  Negative for congestion.   Eyes:  Negative for pain.  Respiratory:  Negative for cough and shortness of breath.   Cardiovascular:  Negative for chest pain, palpitations and leg swelling.  Gastrointestinal:  Negative for abdominal pain.  Genitourinary:  Negative for dysuria and vaginal bleeding.  Musculoskeletal:  Negative for back pain.  Neurological:  Negative for syncope, light-headedness and headaches.  Psychiatric/Behavioral:  Negative for dysphoric mood.    Objective:  BP 102/60 (BP Location: Left Arm, Patient Position: Sitting, Cuff Size: Normal)   Pulse 74   Temp 98.2 F (36.8 C) (Temporal)   Ht 5' 6.5" (  1.689 m)   Wt 170 lb 6 oz (77.3 kg)   SpO2 98%   BMI 27.09 kg/m   Wt Readings from Last 3 Encounters:  08/13/22 170 lb 6 oz (77.3 kg)  04/01/22 172 lb 2 oz (78.1 kg)  02/19/22 190 lb (86.2 kg)      Physical Exam Constitutional:      General: She is not in acute distress.    Appearance: Normal appearance. She is well-developed. She is not ill-appearing or toxic-appearing.  HENT:     Head: Normocephalic.     Right Ear: Hearing, tympanic membrane, ear canal and external ear normal. Tympanic membrane is not erythematous, retracted or bulging.     Left Ear: Hearing, tympanic membrane, ear canal and external ear normal. Tympanic membrane is not erythematous, retracted or bulging.     Nose: No mucosal edema or  rhinorrhea.     Right Sinus: No maxillary sinus tenderness or frontal sinus tenderness.     Left Sinus: No maxillary sinus tenderness or frontal sinus tenderness.     Mouth/Throat:     Pharynx: Uvula midline.  Eyes:     General: Lids are normal. Lids are everted, no foreign bodies appreciated.     Conjunctiva/sclera: Conjunctivae normal.     Pupils: Pupils are equal, round, and reactive to light.  Neck:     Thyroid: No thyroid mass or thyromegaly.     Vascular: No carotid bruit.     Trachea: Trachea normal.  Cardiovascular:     Rate and Rhythm: Normal rate and regular rhythm.     Pulses: Normal pulses.     Heart sounds: Normal heart sounds, S1 normal and S2 normal. No murmur heard.    No friction rub. No gallop.  Pulmonary:     Effort: Pulmonary effort is normal. No tachypnea or respiratory distress.     Breath sounds: Normal breath sounds. No decreased breath sounds, wheezing, rhonchi or rales.  Abdominal:     General: Bowel sounds are normal.     Palpations: Abdomen is soft.     Tenderness: There is no abdominal tenderness.  Musculoskeletal:     Cervical back: Normal range of motion and neck supple.  Skin:    General: Skin is warm and dry.     Findings: No rash.     Comments: Nail whitening at tips... fungal infection vs vita deficiency  Neurological:     Mental Status: She is alert.  Psychiatric:        Mood and Affect: Mood is not anxious or depressed.        Speech: Speech normal.        Behavior: Behavior normal. Behavior is cooperative.        Thought Content: Thought content normal.        Judgment: Judgment normal.       Results for orders placed or performed during the hospital encounter of 02/19/22  Urinalysis, Routine w reflex microscopic Urine, Clean Catch  Result Value Ref Range   Color, Urine STRAW (A) YELLOW   APPearance CLEAR (A) CLEAR   Specific Gravity, Urine 1.004 (L) 1.005 - 1.030   pH 7.0 5.0 - 8.0   Glucose, UA NEGATIVE NEGATIVE mg/dL   Hgb  urine dipstick NEGATIVE NEGATIVE   Bilirubin Urine NEGATIVE NEGATIVE   Ketones, ur NEGATIVE NEGATIVE mg/dL   Protein, ur NEGATIVE NEGATIVE mg/dL   Nitrite NEGATIVE NEGATIVE   Leukocytes,Ua NEGATIVE NEGATIVE  Basic metabolic panel  Result Value Ref Range  Sodium 140 135 - 145 mmol/L   Potassium 4.5 3.5 - 5.1 mmol/L   Chloride 104 98 - 111 mmol/L   CO2 28 22 - 32 mmol/L   Glucose, Bld 118 (H) 70 - 99 mg/dL   BUN 11 8 - 23 mg/dL   Creatinine, Ser 1.47 0.44 - 1.00 mg/dL   Calcium 9.6 8.9 - 82.9 mg/dL   GFR, Estimated >56 >21 mL/min   Anion gap 8 5 - 15  CBC  Result Value Ref Range   WBC 3.7 (L) 4.0 - 10.5 K/uL   RBC 4.41 3.87 - 5.11 MIL/uL   Hemoglobin 12.1 12.0 - 15.0 g/dL   HCT 30.8 65.7 - 84.6 %   MCV 87.5 80.0 - 100.0 fL   MCH 27.4 26.0 - 34.0 pg   MCHC 31.3 30.0 - 36.0 g/dL   RDW 96.2 95.2 - 84.1 %   Platelets 272 150 - 400 K/uL   nRBC 0.0 0.0 - 0.2 %    Assessment and Plan  Other fatigue Assessment & Plan: Acute worsening, will evaluate with in-depth labs given history of vitamin D deficiency B12 deficiency and hypothyroidism. No clear evidence of sleep disruption on Ambien.  No medication side effects clear cause. No sign or symptom of sleep apnea.  Denies depression and anxiety.  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  Can use ibuprofen as needed for pain.   Other orders -     Cyclobenzaprine HCl; Take 0.5-1 tablets (5-10 mg total) by mouth at bedtime.  Dispense: 30 tablet; Refill: 0    No follow-ups on file.   Kerby Nora, MD

## 2022-08-13 NOTE — Assessment & Plan Note (Signed)
Acute worsening, will evaluate with in-depth labs given history of vitamin D deficiency B12 deficiency and hypothyroidism. No clear evidence of sleep disruption on Ambien.  No medication side effects clear cause. No sign or symptom of sleep apnea.  Denies depression and anxiety.

## 2022-08-14 LAB — CBC WITH DIFFERENTIAL/PLATELET
Eosinophils Relative: 0.8 %
HCT: 36.8 % (ref 35.0–45.0)
MCV: 87.8 fL (ref 80.0–100.0)
Neutrophils Relative %: 79.2 %
Platelets: 275 10*3/uL (ref 140–400)
RBC: 4.19 10*6/uL (ref 3.80–5.10)
WBC: 7.1 10*3/uL (ref 3.8–10.8)

## 2022-08-14 LAB — IRON, TOTAL/TOTAL IRON BINDING CAP: Iron: 63 ug/dL (ref 45–160)

## 2022-08-14 LAB — COMPREHENSIVE METABOLIC PANEL
Glucose, Bld: 116 mg/dL — ABNORMAL HIGH (ref 65–99)
Potassium: 4.9 mmol/L (ref 3.5–5.3)
Sodium: 143 mmol/L (ref 135–146)

## 2022-08-14 LAB — HEMOGLOBIN A1C: Mean Plasma Glucose: 126 mg/dL

## 2022-08-14 LAB — TSH: TSH: 0.63 mIU/L (ref 0.40–4.50)

## 2022-08-15 LAB — COMPREHENSIVE METABOLIC PANEL
AG Ratio: 1.9 (calc) (ref 1.0–2.5)
ALT: 21 U/L (ref 6–29)
AST: 21 U/L (ref 10–35)
Albumin: 4.3 g/dL (ref 3.6–5.1)
Alkaline phosphatase (APISO): 68 U/L (ref 37–153)
BUN: 19 mg/dL (ref 7–25)
CO2: 29 mmol/L (ref 20–32)
Calcium: 9.4 mg/dL (ref 8.6–10.4)
Chloride: 107 mmol/L (ref 98–110)
Creat: 0.74 mg/dL (ref 0.60–1.00)
Globulin: 2.3 g/dL (calc) (ref 1.9–3.7)
Total Bilirubin: 0.8 mg/dL (ref 0.2–1.2)
Total Protein: 6.6 g/dL (ref 6.1–8.1)

## 2022-08-15 LAB — CBC WITH DIFFERENTIAL/PLATELET
Hemoglobin: 12.1 g/dL (ref 11.7–15.5)
Lymphs Abs: 1072 cells/uL (ref 850–3900)
MCHC: 32.9 g/dL (ref 32.0–36.0)

## 2022-08-15 LAB — IRON, TOTAL/TOTAL IRON BINDING CAP
%SAT: 16 % (calc) (ref 16–45)
TIBC: 394 mcg/dL (calc) (ref 250–450)

## 2022-08-15 LAB — VITAMIN B12: Vitamin B-12: 592 pg/mL (ref 200–1100)

## 2022-08-15 LAB — VITAMIN D 25 HYDROXY (VIT D DEFICIENCY, FRACTURES): Vit D, 25-Hydroxy: 25 ng/mL — ABNORMAL LOW (ref 30–100)

## 2022-08-15 LAB — HEMOGLOBIN A1C
Hgb A1c MFr Bld: 6 % of total Hgb — ABNORMAL HIGH (ref <5.7)
eAG (mmol/L): 7 mmol/L

## 2022-08-15 LAB — FERRITIN: Ferritin: 20 ng/mL (ref 16–288)

## 2022-08-18 ENCOUNTER — Other Ambulatory Visit: Payer: Self-pay | Admitting: *Deleted

## 2022-08-18 MED ORDER — VITAMIN D3 1.25 MG (50000 UT) PO TABS: 1.0000 | ORAL_TABLET | ORAL | 0 refills | Status: DC

## 2022-08-19 ENCOUNTER — Other Ambulatory Visit: Payer: Self-pay | Admitting: Family Medicine

## 2022-08-20 NOTE — Telephone Encounter (Signed)
LAST APPOINTMENT DATE: 08/13/2022 for fatigue and back pain   NEXT APPOINTMENT DATE: Visit date not found    LAST REFILL: 07/22/2022   QTY: #30 w/ 2 refills

## 2022-09-21 ENCOUNTER — Other Ambulatory Visit: Payer: Self-pay | Admitting: Family Medicine

## 2022-09-21 NOTE — Telephone Encounter (Signed)
Last office visit 08/13/22 for fatigue, cold tolerance and lumbar back pain.  Last refilled 08/13/22 for #30 with no refills.  Next Appt: No future appointments.

## 2022-10-19 ENCOUNTER — Ambulatory Visit (INDEPENDENT_AMBULATORY_CARE_PROVIDER_SITE_OTHER)
Admission: RE | Admit: 2022-10-19 | Discharge: 2022-10-19 | Disposition: A | Discharge status: Home / Self Care | Payer: Medicare PPO | Source: Ambulatory Visit | Attending: Family Medicine | Admitting: Family Medicine

## 2022-10-19 ENCOUNTER — Ambulatory Visit (INDEPENDENT_AMBULATORY_CARE_PROVIDER_SITE_OTHER): Payer: Medicare PPO | Admitting: Family Medicine

## 2022-10-19 ENCOUNTER — Encounter: Payer: Self-pay | Admitting: Family Medicine

## 2022-10-19 VITALS — BP 110/60 | HR 73 | Temp 98.1°F | Ht 66.5 in | Wt 173.5 lb

## 2022-10-19 DIAGNOSIS — M5441 Lumbago with sciatica, right side: Secondary | ICD-10-CM

## 2022-10-19 DIAGNOSIS — M25562 Pain in left knee: Secondary | ICD-10-CM

## 2022-10-19 DIAGNOSIS — M25551 Pain in right hip: Secondary | ICD-10-CM | POA: Diagnosis not present

## 2022-10-19 DIAGNOSIS — M25561 Pain in right knee: Secondary | ICD-10-CM | POA: Diagnosis not present

## 2022-10-19 NOTE — Progress Notes (Signed)
Patient ID: Jody White, female    DOB: 1950-05-08, 72 y.o.   MRN: [id number]  This visit was conducted in person.  BP 110/60 (BP Location: Left Arm, Patient Position: Sitting, Cuff Size: Normal)   Pulse 73   Temp 98.1 F (36.7 C) (Temporal)   Ht 5' 6.5" (1.689 m)   Wt 173 lb 8 oz (78.7 kg)   SpO2 98%   BMI 27.58 kg/m    CC:  Chief Complaint  Patient presents with   Hip Pain    Grinding Sensation   Leg Pain   Fatigue   Referral    Peridontal-Losing bone   Chills   Knee Pain    Weakness    Subjective:   HPI: Jody White is a 72 y.o. female  with history of lumbar back pain presenting on 10/19/2022 for Hip Pain (Grinding Sensation), Leg Pain, Fatigue, Referral (Peridontal-Losing bone), Chills, and Knee Pain (Weakness)   Jody White reports  new onset pain   in right hip for months... mainly posterior and lateral.  Radiates to right leg.   Wakes her at night.  No numbness, but  feels occ weakness in right leg.  Has use flexeril and home PT.  Tylenol does not help   Bilatereral knee  weakness, no redness, no swelling  Jody White needs a referral to a periodontist for gum disease.  Last dental visit 12/2021  Jody White has upcoming OV  for cleaning in 10.24   Jody White continues to note lack of energy and feeling cold x months  NML TSH, B12, cbc , iron in 07/2022  Vit D was low started supplement.. no improvement in the fatigue.   Jody White sleep 6 hours a night for ambien I have reviewed.  Reviewed..  I reviewed the liters and the urine 340  No snoring.  No depression.   Per pt chronic fatigue in the past. On and off exercise in last few weeks.  Relevant past medical, surgical, family and social history reviewed and updated as indicated. Interim medical history since our last visit reviewed. Allergies and medications reviewed and updated. Outpatient Medications Prior to Visit  Medication Sig Dispense Refill   acetaminophen (TYLENOL) 500 MG tablet Take 1 tablet (500 mg total) by  mouth every 6 (six) hours as needed. 30 tablet 0   Cholecalciferol (VITAMIN D3) 1.25 MG (50000 UT) TABS Take 1 tablet by mouth every 7 (seven) days. 12 tablet 0   cyanocobalamin (VITAMIN B12) 1000 MCG/ML injection Inject 1 mL (1,000 mcg total) into the muscle every 30 (thirty) days. 3 mL 3   cyclobenzaprine (FLEXERIL) 10 MG tablet TOME DE MEDIA A UNA TABLETA (5-10 MG TOTAL) POR VIA ORAL TODOS LOS DIAS AL ACOSTARSE 30 tablet 0   levothyroxine (SYNTHROID) 125 MCG tablet Take 1 tablet (125 mcg total) by mouth daily. 90 tablet 3   zolpidem (AMBIEN) 10 MG tablet Take 1 tablet (10 mg total) by mouth at bedtime as needed for sleep. 30 tablet 2   No facility-administered medications prior to visit.     Per HPI unless specifically indicated in ROS section below Review of Systems  Constitutional:  Positive for fatigue. Negative for fever.  HENT:  Negative for congestion.   Eyes:  Negative for pain.  Respiratory:  Negative for cough and shortness of breath.   Cardiovascular:  Negative for chest pain, palpitations and leg swelling.  Gastrointestinal:  Negative for abdominal pain.  Endocrine: Positive for cold intolerance.  Genitourinary:  Negative for dysuria  and vaginal bleeding.  Musculoskeletal:  Positive for arthralgias, back pain and gait problem.  Neurological:  Positive for weakness. Negative for syncope, light-headedness and headaches.  Psychiatric/Behavioral:  Negative for dysphoric mood.    Objective:  BP 110/60 (BP Location: Left Arm, Patient Position: Sitting, Cuff Size: Normal)   Pulse 73   Temp 98.1 F (36.7 C) (Temporal)   Ht 5' 6.5" (1.689 m)   Wt 173 lb 8 oz (78.7 kg)   SpO2 98%   BMI 27.58 kg/m   Wt Readings from Last 3 Encounters:  10/19/22 173 lb 8 oz (78.7 kg)  08/13/22 170 lb 6 oz (77.3 kg)  04/01/22 172 lb 2 oz (78.1 kg)      Physical Exam Constitutional:      General: Jody White is not in acute distress.    Appearance: Normal appearance. Jody White is well-developed. Jody White is  not ill-appearing or toxic-appearing.  HENT:     Head: Normocephalic.     Right Ear: Hearing, tympanic membrane, ear canal and external ear normal. Tympanic membrane is not erythematous, retracted or bulging.     Left Ear: Hearing, tympanic membrane, ear canal and external ear normal. Tympanic membrane is not erythematous, retracted or bulging.     Nose: No mucosal edema or rhinorrhea.     Right Sinus: No maxillary sinus tenderness or frontal sinus tenderness.     Left Sinus: No maxillary sinus tenderness or frontal sinus tenderness.     Mouth/Throat:     Mouth: Oropharynx is clear and moist and mucous membranes are normal.     Pharynx: Uvula midline.  Eyes:     General: Lids are normal. Lids are everted, no foreign bodies appreciated.     Extraocular Movements: EOM normal.     Conjunctiva/sclera: Conjunctivae normal.     Pupils: Pupils are equal, round, and reactive to light.  Neck:     Thyroid: No thyroid mass or thyromegaly.     Vascular: No carotid bruit.     Trachea: Trachea normal.  Cardiovascular:     Rate and Rhythm: Normal rate and regular rhythm.     Pulses: Normal pulses.     Heart sounds: Normal heart sounds, S1 normal and S2 normal. No murmur heard.    No friction rub. No gallop.  Pulmonary:     Effort: Pulmonary effort is normal. No tachypnea or respiratory distress.     Breath sounds: Normal breath sounds. No decreased breath sounds, wheezing, rhonchi or rales.  Abdominal:     General: Bowel sounds are normal.     Palpations: Abdomen is soft.     Tenderness: There is no abdominal tenderness.  Musculoskeletal:     Cervical back: Normal, normal range of motion and neck supple.     Thoracic back: Normal.     Lumbar back: Tenderness present. No bony tenderness. Decreased range of motion. Positive right straight leg raise test. Negative left straight leg raise test.     Right hip: Tenderness present. No deformity, bony tenderness or crepitus. Normal range of motion.  Normal strength.     Left hip: No deformity, tenderness, bony tenderness or crepitus. Normal range of motion. Normal strength.     Right upper leg: Normal.     Left upper leg: Normal.     Right knee: Normal. No swelling, deformity, effusion, erythema or ecchymosis. Normal range of motion.     Left knee: Normal. No swelling, deformity, effusion or erythema. Normal range of motion.     Comments:  Tender to palpation along bilateral sciatic notch,  Negative Faber's bilaterally Tender to palpation laterally over trochanteric bursa on right  Skin:    General: Skin is warm, dry and intact.     Findings: No rash.  Neurological:     Mental Status: Jody White is alert.  Psychiatric:        Mood and Affect: Mood is not anxious or depressed.        Speech: Speech normal.        Behavior: Behavior normal. Behavior is cooperative.        Thought Content: Thought content normal.        Cognition and Memory: Cognition and memory normal.        Judgment: Judgment normal.       Results for orders placed or performed in visit on 08/13/22  CBC with Differential/Platelet  Result Value Ref Range   WBC 7.1 3.8 - 10.8 Thousand/uL   RBC 4.19 3.80 - 5.10 Million/uL   Hemoglobin 12.1 11.7 - 15.5 g/dL   HCT 84.6 96.2 - 95.2 %   MCV 87.8 80.0 - 100.0 fL   MCH 28.9 27.0 - 33.0 pg   MCHC 32.9 32.0 - 36.0 g/dL   RDW 84.1 32.4 - 40.1 %   Platelets 275 140 - 400 Thousand/uL   MPV 10.0 7.5 - 12.5 fL   Neutro Abs 5,623 1,500 - 7,800 cells/uL   Lymphs Abs 1,072 850 - 3,900 cells/uL   Absolute Monocytes 320 200 - 950 cells/uL   Eosinophils Absolute 57 15 - 500 cells/uL   Basophils Absolute 28 0 - 200 cells/uL   Neutrophils Relative % 79.2 %   Total Lymphocyte 15.1 %   Monocytes Relative 4.5 %   Eosinophils Relative 0.8 %   Basophils Relative 0.4 %  Comprehensive metabolic panel  Result Value Ref Range   Glucose, Bld 116 (H) 65 - 99 mg/dL   BUN 19 7 - 25 mg/dL   Creat 0.27 2.53 - 6.64 mg/dL   BUN/Creatinine  Ratio SEE NOTE: 6 - 22 (calc)   Sodium 143 135 - 146 mmol/L   Potassium 4.9 3.5 - 5.3 mmol/L   Chloride 107 98 - 110 mmol/L   CO2 29 20 - 32 mmol/L   Calcium 9.4 8.6 - 10.4 mg/dL   Total Protein 6.6 6.1 - 8.1 g/dL   Albumin 4.3 3.6 - 5.1 g/dL   Globulin 2.3 1.9 - 3.7 g/dL (calc)   AG Ratio 1.9 1.0 - 2.5 (calc)   Total Bilirubin 0.8 0.2 - 1.2 mg/dL   Alkaline phosphatase (APISO) 68 37 - 153 U/L   AST 21 10 - 35 U/L   ALT 21 6 - 29 U/L  Ferritin  Result Value Ref Range   Ferritin 20 16 - 288 ng/mL  Hemoglobin A1c  Result Value Ref Range   Hgb A1c MFr Bld 6.0 (H) <5.7 % of total Hgb   Mean Plasma Glucose 126 mg/dL   eAG (mmol/L) 7.0 mmol/L  Iron, Total/Total Iron Binding Cap  Result Value Ref Range   Iron 63 45 - 160 mcg/dL   TIBC 403 474 - 259 mcg/dL (calc)   %SAT 16 16 - 45 % (calc)  TSH  Result Value Ref Range   TSH 0.63 0.40 - 4.50 mIU/L  Vitamin B12  Result Value Ref Range   Vitamin B-12 592 200 - 1,100 pg/mL  VITAMIN D 25 Hydroxy (Vit-D Deficiency, Fractures)  Result Value Ref Range   Vit D, 25-Hydroxy  25 (L) 30 - 100 ng/mL    Assessment and Plan  Acute right-sided low back pain with right-sided sciatica -     DG Lumbar Spine Complete; Future  Bilateral anterior knee pain -     DG Knee Bilateral Standing AP; Future  Acute right hip pain -     DG HIP UNILAT W OR W/O PELVIS 2-3 VIEWS RIGHT; Future   Symptoms most consistent with sciatica versus spinal stenosis and lumbar spine resulting in pain in right buttock radiating down right leg to knee.  This may be contributing to a feeling of knee weakness.  Jody White does have some milder symptoms on the left side. Recommended home physical therapy, Low back and offered anti-inflammatory but Jody White is not interested at this time. We will evaluate her low back with plain film.  Right hip pain is most consistent with trochanteric bursitis versus radiating from low back, but will evaluate right hip x-ray to determine if there is  an element of osteoarthritis in the hip.  Jody White does have very good range of motion and her right hip. Normal knee exam and doubt osteoarthritis or acute knee injury.  Symptoms more likely referred from low back, but given weakness in knees per patient Jody White requests knee x-ray. No follow-ups on file.   Kerby Nora, MD

## 2022-10-21 ENCOUNTER — Other Ambulatory Visit: Payer: Self-pay | Admitting: Family Medicine

## 2022-10-21 NOTE — Telephone Encounter (Signed)
Last office visit 10/19/2022 for multiple issues.  Last refilled 09/21/22 for #30 with no refills.  Next appt;  No future appointments.

## 2022-11-09 ENCOUNTER — Other Ambulatory Visit: Payer: Self-pay | Admitting: Family Medicine

## 2022-11-09 NOTE — Telephone Encounter (Signed)
Last office visit 10/19/2022 for knee/hip and back pain.  Last refilled 08/18/2022 for #12 with no refills.  Vit D level 08/13/22 with was low at 25 ng/mL.  Next Appt: No future appointments.

## 2022-11-19 ENCOUNTER — Other Ambulatory Visit: Payer: Self-pay | Admitting: Family Medicine

## 2022-11-19 NOTE — Telephone Encounter (Signed)
Last office visit 10/19/2022 for acute right-sided back pain with sciatica.  Last refilled 07/22/2022 for #30 with 2 refills.  Next Appt: No future appointments.

## 2022-11-19 NOTE — Telephone Encounter (Signed)
Last office visit 10/19/2022 for acute right-sided back pain with sciatica.  Last refilled 10/22/2022 for #30 with no refills.  Next Appt: No future appointments.

## 2022-11-29 ENCOUNTER — Telehealth: Payer: Self-pay | Admitting: Family Medicine

## 2022-11-29 NOTE — Telephone Encounter (Signed)
X-ray results were sent to MyChart but patient does not have MyChart.  Patient is scheduled with Dr. Ermalene Searing on 11/30/2022 at 3:40 pm.  Will have Dr. Ermalene Searing discuss these results with her at that appointment.

## 2022-11-29 NOTE — Telephone Encounter (Signed)
Patient stopped by to ask about hip xray results for xray she got done on 10/25/2022. She said that she hasn't heard anything back regarding them.

## 2022-11-30 ENCOUNTER — Ambulatory Visit (INDEPENDENT_AMBULATORY_CARE_PROVIDER_SITE_OTHER): Payer: Medicare PPO | Admitting: Family Medicine

## 2022-11-30 ENCOUNTER — Encounter: Payer: Self-pay | Admitting: Family Medicine

## 2022-11-30 VITALS — BP 110/60 | HR 80 | Temp 98.5°F | Ht 66.5 in | Wt 174.4 lb

## 2022-11-30 DIAGNOSIS — M5416 Radiculopathy, lumbar region: Secondary | ICD-10-CM

## 2022-11-30 DIAGNOSIS — R8290 Unspecified abnormal findings in urine: Secondary | ICD-10-CM | POA: Diagnosis not present

## 2022-11-30 DIAGNOSIS — M25551 Pain in right hip: Secondary | ICD-10-CM | POA: Diagnosis not present

## 2022-11-30 DIAGNOSIS — W57XXXA Bitten or stung by nonvenomous insect and other nonvenomous arthropods, initial encounter: Secondary | ICD-10-CM

## 2022-11-30 DIAGNOSIS — S80262A Insect bite (nonvenomous), left knee, initial encounter: Secondary | ICD-10-CM | POA: Insufficient documentation

## 2022-11-30 LAB — POC URINALSYSI DIPSTICK (AUTOMATED)
Bilirubin, UA: NEGATIVE
Blood, UA: NEGATIVE
Glucose, UA: NEGATIVE
Ketones, UA: NEGATIVE
Leukocytes, UA: NEGATIVE
Nitrite, UA: NEGATIVE
Protein, UA: NEGATIVE
Spec Grav, UA: 1.01 (ref 1.010–1.025)
Urobilinogen, UA: 0.2 U/dL
pH, UA: 7.5 (ref 5.0–8.0)

## 2022-11-30 MED ORDER — MUPIROCIN 2 % EX OINT: 1.0000 | TOPICAL_OINTMENT | Freq: Two times a day (BID) | CUTANEOUS | 0 refills | Status: DC

## 2022-11-30 MED ORDER — MELOXICAM 7.5 MG PO TABS: 7.5000 mg | ORAL_TABLET | Freq: Every day | ORAL | 0 refills | Status: DC

## 2022-11-30 MED ORDER — BETAMETHASONE DIPROPIONATE 0.05 % EX CREA: TOPICAL_CREAM | Freq: Two times a day (BID) | CUTANEOUS | 0 refills | Status: DC

## 2022-11-30 NOTE — Assessment & Plan Note (Addendum)
Acute flare of chronic issue Most likely secondary to back issue versus right osteoarthritis seen on hip film.  Meloxicam 7.5 mg p.o. daily as needed

## 2022-11-30 NOTE — Assessment & Plan Note (Addendum)
Acute, possibly secondary to decreased water intake.  No clear sign of infection at this time.   No protein seen in urine.

## 2022-11-30 NOTE — Patient Instructions (Signed)
Can use meloxicam  as needed daily for arthritis pain in hip and back.  Continue regular exercise.  Can use glucosamine for joint health.

## 2022-11-30 NOTE — Assessment & Plan Note (Signed)
Chronic intermittent.  Reviewed x-rays in detail with patient showing osteoarthritis.  Discussed options to treat including home physical therapy, possible referral for pain management/steroid injection. Will have her start meloxicam 7.5 mg daily but she is hesitant about starting medication and would like to limit it is much as possible.

## 2022-11-30 NOTE — Progress Notes (Signed)
Patient ID: Jody White, female    DOB: [birth year], 72 y.o.   MRN: [id number]  This visit was conducted in person.  BP 110/60 (BP Location: Left Arm, Patient Position: Sitting, Cuff Size: Normal)   Pulse 80   Temp 98.5 F (36.9 C) (Temporal)   Ht 5' 6.5" (1.689 m)   Wt 174 lb 6 oz (79.1 kg)   SpO2 99%   BMI 27.72 kg/m    CC:  Chief Complaint  Patient presents with   Cloudy Urine   Insect Bite    Back on Left Knee-Used Panderm 3   Results    X-ray results    Subjective:   HPI: Jody White is a 72 y.o. female presenting on 11/30/2022 for Cloudy Urine, Insect Bite (Back on Left Knee-Used Panderm 3), and Results (X-ray results)   10 days cloudy urine. No dysuria, funny feeling in lower abdomen. Temporarily.  No fever.  No urgency or frequency.     New insect bite 1 week ago on left posterior lower leg.   Warm compresses, some discharge.  No fever.  Appyling  topical med from [place].[facility] gentamicin/betamethasone/ clotrimazole   Continued right hip, and low back pain.. using ibuprofen or flexeril prn.  Does not want referral for injections or to take daily medication.  Relevant past medical, surgical, family and social history reviewed and updated as indicated. Interim medical history since our last visit reviewed. Allergies and medications reviewed and updated. Outpatient Medications Prior to Visit  Medication Sig Dispense Refill   acetaminophen (TYLENOL) 500 MG tablet Take 1 tablet (500 mg total) by mouth every 6 (six) hours as needed. 30 tablet 0   Cholecalciferol (VITAMIN D3) 1.25 MG (50000 UT) CAPS TAKE 1 TABLET BY MOUTH EVERY 7 DAYS. 12 capsule 0   cyanocobalamin (VITAMIN B12) 1000 MCG/ML injection Inject 1 mL (1,000 mcg total) into the muscle every 30 (thirty) days. 3 mL 3   cyclobenzaprine (FLEXERIL) 10 MG tablet TOME DE MEDIA A UNA TABLETA (5-10 MG TOTAL) POR VIA ORAL TODOS LOS DIAS AL ACOSTARSE 30 tablet 0   levothyroxine (SYNTHROID) 125 MCG tablet  Take 1 tablet (125 mcg total) by mouth daily. 90 tablet 3   zolpidem (AMBIEN) 10 MG tablet TAKE 1 TABLET BY MOUTH AT BEDTIME AS NEEDED FOR SLEEP. 30 tablet 2   No facility-administered medications prior to visit.     Per HPI unless specifically indicated in ROS section below Review of Systems  Constitutional:  Negative for fatigue and fever.  HENT:  Negative for congestion.   Eyes:  Negative for pain.  Respiratory:  Negative for cough and shortness of breath.   Cardiovascular:  Negative for chest pain, palpitations and leg swelling.  Gastrointestinal:  Negative for abdominal pain.  Genitourinary:  Negative for dysuria and vaginal bleeding.  Musculoskeletal:  Negative for back pain.  Neurological:  Negative for syncope, light-headedness and headaches.  Psychiatric/Behavioral:  Negative for dysphoric mood.    Objective:  BP 110/60 (BP Location: Left Arm, Patient Position: Sitting, Cuff Size: Normal)   Pulse 80   Temp 98.5 F (36.9 C) (Temporal)   Ht 5' 6.5" (1.689 m)   Wt 174 lb 6 oz (79.1 kg)   SpO2 99%   BMI 27.72 kg/m   Wt Readings from Last 3 Encounters:  11/30/22 174 lb 6 oz (79.1 kg)  10/19/22 173 lb 8 oz (78.7 kg)  08/13/22 170 lb 6 oz (77.3 kg)      Physical Exam  Constitutional:      General: She is not in acute distress.    Appearance: Normal appearance. She is well-developed. She is not ill-appearing or toxic-appearing.  HENT:     Head: Normocephalic.     Right Ear: Hearing, tympanic membrane, ear canal and external ear normal. Tympanic membrane is not erythematous, retracted or bulging.     Left Ear: Hearing, tympanic membrane, ear canal and external ear normal. Tympanic membrane is not erythematous, retracted or bulging.     Nose: No mucosal edema or rhinorrhea.     Right Sinus: No maxillary sinus tenderness or frontal sinus tenderness.     Left Sinus: No maxillary sinus tenderness or frontal sinus tenderness.     Mouth/Throat:     Mouth: Oropharynx is clear  and moist and mucous membranes are normal.     Pharynx: Uvula midline.  Eyes:     General: Lids are normal. Lids are everted, no foreign bodies appreciated.     Extraocular Movements: EOM normal.     Conjunctiva/sclera: Conjunctivae normal.     Pupils: Pupils are equal, round, and reactive to light.  Neck:     Thyroid: No thyroid mass or thyromegaly.     Vascular: No carotid bruit.     Trachea: Trachea normal.  Cardiovascular:     Rate and Rhythm: Normal rate and regular rhythm.     Pulses: Normal pulses.     Heart sounds: Normal heart sounds, S1 normal and S2 normal. No murmur heard.    No friction rub. No gallop.  Pulmonary:     Effort: Pulmonary effort is normal. No tachypnea or respiratory distress.     Breath sounds: Normal breath sounds. No decreased breath sounds, wheezing, rhonchi or rales.  Abdominal:     General: Bowel sounds are normal.     Palpations: Abdomen is soft.     Tenderness: There is no abdominal tenderness.  Musculoskeletal:     Cervical back: Normal range of motion and neck supple.  Skin:    General: Skin is warm, dry and intact.     Findings: No rash.     Comments: Hyperpigmented area around nodular skin lesion left posterior knee, no fluctuance  Neurological:     Mental Status: She is alert.  Psychiatric:        Mood and Affect: Mood is not anxious or depressed.        Speech: Speech normal.        Behavior: Behavior normal. Behavior is cooperative.        Thought Content: Thought content normal.        Cognition and Memory: Cognition and memory normal.        Judgment: Judgment normal.       Results for orders placed or performed in visit on 11/30/22  POCT Urinalysis Dipstick (Automated)  Result Value Ref Range   Color, UA Yellow    Clarity, UA Clear    Glucose, UA Negative Negative   Bilirubin, UA Negative    Ketones, UA Negative    Spec Grav, UA 1.010 1.010 - 1.025   Blood, UA Negative    pH, UA 7.5 5.0 - 8.0   Protein, UA Negative  Negative   Urobilinogen, UA 0.2 0.2 or 1.0 E.U./dL   Nitrite, UA Negative    Leukocytes, UA Negative Negative    Assessment and Plan  Cloudy urine Assessment & Plan: Acute, possibly secondary to decreased water intake.  No clear sign of infection at this  time.   No protein seen in urine.  Orders: -     POCT Urinalysis Dipstick (Automated)  Lumbar back pain with radiculopathy affecting right lower extremity Assessment & Plan: Chronic intermittent.  Reviewed x-rays in detail with patient showing osteoarthritis.  Discussed options to treat including home physical therapy, possible referral for pain management/steroid injection. Will have her start meloxicam 7.5 mg daily but she is hesitant about starting medication and would like to limit it is much as possible.   Acute right hip pain Assessment & Plan: Acute flare of chronic issue Most likely secondary to back issue versus right osteoarthritis seen on hip film.  Meloxicam 7.5 mg p.o. daily as needed     Insect bite of left knee, initial encounter Assessment & Plan: Acute, resolving infection/allergic reaction  Refilled prescription for topical steroid and topical antibiotic cream.   Other orders -     Mupirocin; Apply 1 Application topically 2 (two) times daily.  Dispense: 22 g; Refill: 0 -     Betamethasone Dipropionate; Apply topically 2 (two) times daily.  Dispense: 30 g; Refill: 0 -     Meloxicam; Take 1 tablet (7.5 mg total) by mouth daily.  Dispense: 30 tablet; Refill: 0    No follow-ups on file.   Kerby Nora, MD

## 2022-11-30 NOTE — Assessment & Plan Note (Signed)
Acute, resolving infection/allergic reaction  Refilled prescription for topical steroid and topical antibiotic cream.

## 2022-12-07 ENCOUNTER — Telehealth: Payer: Self-pay | Admitting: Family Medicine

## 2022-12-07 NOTE — Telephone Encounter (Signed)
Patient called in and had some questions regarding someone coming to her house about an appointment. Thank you!

## 2022-12-07 NOTE — Telephone Encounter (Signed)
Spoke with Jody White.  She states they were able to clarify appointment she was in calling about.  It was something for their daughter [name].  Nothing further is needed.

## 2022-12-29 ENCOUNTER — Other Ambulatory Visit: Payer: Self-pay | Admitting: Family Medicine

## 2023-01-18 ENCOUNTER — Telehealth: Payer: Self-pay | Admitting: Family Medicine

## 2023-01-18 MED ORDER — DICYCLOMINE HCL 10 MG PO CAPS: 10.0000 mg | ORAL_CAPSULE | Freq: Three times a day (TID) | ORAL | 0 refills | Status: DC

## 2023-01-18 NOTE — Telephone Encounter (Signed)
Dicyclomine is not on medication list.

## 2023-01-18 NOTE — Addendum Note (Signed)
Addended by: Kerby Nora E on: 01/18/2023 06:09 PM   Modules accepted: Orders

## 2023-01-18 NOTE — Telephone Encounter (Signed)
Prescription Request  01/18/2023  LOV: 11/30/2022  What is the name of the medication or equipment? dicyclomine  Have you contacted your pharmacy to request a refill? No   Which pharmacy would you like this sent to?  [id number] [place] [facility], [place] - [address] [place] [address] Phone: [phone] Fax: [phone]     Patient notified that their request is being sent to the clinical staff for review and that they should receive a response within 2 business days.   Please advise at Mobile [phone] (mobile)  Meds are discontinued. Pt states she's having some stomach issues again & needs meds.

## 2023-02-07 ENCOUNTER — Other Ambulatory Visit: Payer: Self-pay | Admitting: Family Medicine

## 2023-02-08 ENCOUNTER — Other Ambulatory Visit: Payer: Self-pay | Admitting: Family Medicine

## 2023-02-08 NOTE — Telephone Encounter (Signed)
Last office visit 11/30/2022 for cloudy urine and insect bite.  Last refilled 11/09/2022 for #12 with no refills.  Vit D level 08/13/2022 which was low at 25 ng/mL.  Next Appt: No future appointments.

## 2023-02-17 ENCOUNTER — Other Ambulatory Visit: Payer: Self-pay | Admitting: Family Medicine

## 2023-02-21 NOTE — Telephone Encounter (Signed)
Last office visit 11/30/2022 for cloudy urine, insect bite and hip/back pain.  Last refilled 11/19/2022 for #30 with 2 refills.  Next Appt: No future appointments.

## 2023-03-23 ENCOUNTER — Other Ambulatory Visit: Payer: Self-pay | Admitting: Family Medicine

## 2023-03-24 NOTE — Telephone Encounter (Signed)
 Last office visit 11/30/2022 for cloudy urine, insect bite and results.  Last refilled 11/19/2022 for #30 with no refills.  Next Appt: No future appointments.

## 2023-04-13 DIAGNOSIS — H0288A Meibomian gland dysfunction right eye, upper and lower eyelids: Secondary | ICD-10-CM | POA: Diagnosis not present

## 2023-04-13 DIAGNOSIS — H2513 Age-related nuclear cataract, bilateral: Secondary | ICD-10-CM | POA: Diagnosis not present

## 2023-04-13 DIAGNOSIS — H52213 Irregular astigmatism, bilateral: Secondary | ICD-10-CM | POA: Diagnosis not present

## 2023-04-13 DIAGNOSIS — H524 Presbyopia: Secondary | ICD-10-CM | POA: Diagnosis not present

## 2023-04-13 DIAGNOSIS — Z9889 Other specified postprocedural states: Secondary | ICD-10-CM | POA: Diagnosis not present

## 2023-04-13 DIAGNOSIS — H0288B Meibomian gland dysfunction left eye, upper and lower eyelids: Secondary | ICD-10-CM | POA: Diagnosis not present

## 2023-05-18 ENCOUNTER — Other Ambulatory Visit: Payer: Self-pay | Admitting: Family Medicine

## 2023-05-18 NOTE — Telephone Encounter (Signed)
 Last office visit 11/30/2022 for cloudy urine and insect bite.  Last refilled 03/24/23 for #30 with no refills.  Next appt: No future appointments.

## 2023-06-18 ENCOUNTER — Other Ambulatory Visit: Payer: Self-pay | Admitting: Family Medicine

## 2023-06-20 NOTE — Telephone Encounter (Signed)
 Last office visit 11/30/22 for cloudy urine, insect bite and back/hip pain.  Last refilled 05/18/2023 for #30 with no refills.  Next Appt: No future appointments.

## 2023-07-17 ENCOUNTER — Other Ambulatory Visit: Payer: Self-pay | Admitting: Family Medicine

## 2023-07-18 ENCOUNTER — Other Ambulatory Visit: Payer: Self-pay | Admitting: Family Medicine

## 2023-07-18 NOTE — Telephone Encounter (Signed)
 Please schedule Medicare Wellness with Steward Drone and CPE with fasting labs prior with Dr. Ermalene Searing.

## 2023-07-18 NOTE — Telephone Encounter (Signed)
 LVM for patient to call to schedule

## 2023-07-18 NOTE — Telephone Encounter (Signed)
 Last office visit 11/30/2022 for cloudy urine and lumbar back pain.  Last refilled  Cyclobenzaprine  06/21/23 for #30 with no refills.  Zolpidem  02/21/2023 for #30 with 2 refills.  Next Appt: No future appointments.

## 2023-07-20 NOTE — Telephone Encounter (Signed)
 Lvm to schedule appt.

## 2023-08-15 ENCOUNTER — Other Ambulatory Visit: Payer: Self-pay | Admitting: Family Medicine

## 2023-08-16 NOTE — Telephone Encounter (Signed)
 Last office visit 11/30/2022 for cloudy urine, insect bite and back/hip pain.  Last refilled 07/19/2023 for #30 with no refills.  Next Appt: CPE 09/27/2023.

## 2023-08-30 ENCOUNTER — Telehealth: Payer: Self-pay

## 2023-08-30 NOTE — Telephone Encounter (Signed)
Noted. Agree with ED eval

## 2023-08-30 NOTE — Telephone Encounter (Signed)
 Pts husband came to office; Pt is at home; for 1 week pt not feeling well. Dizziness, heart palpitations,Headache, could not put value on how bad head was hurting. Pt feels like she is going to pass out; pt has hx of syncope; pt has not passed out yet.pt  also has low back pain and burning and frequency of urine. Pt has been under a lot of stress per pts husband. No CP,SOB or fever. Pt husband took BP earlier today 145/87 P 72 which Mr Shampine said was high for pt. Pts husband thinks pt needs to be seen today. No available appts at [facility] or [facility] Mr [place] said he is taking pt to [facility] ED. Sending note to Dr Cherlyn Cornet and I spoke with Abe Abed CMA.

## 2023-09-01 ENCOUNTER — Telehealth: Payer: Self-pay | Admitting: *Deleted

## 2023-09-01 DIAGNOSIS — E538 Deficiency of other specified B group vitamins: Secondary | ICD-10-CM

## 2023-09-01 DIAGNOSIS — R6889 Other general symptoms and signs: Secondary | ICD-10-CM

## 2023-09-01 DIAGNOSIS — E065 Other chronic thyroiditis: Secondary | ICD-10-CM

## 2023-09-01 DIAGNOSIS — Z1159 Encounter for screening for other viral diseases: Secondary | ICD-10-CM

## 2023-09-01 DIAGNOSIS — D508 Other iron deficiency anemias: Secondary | ICD-10-CM

## 2023-09-01 DIAGNOSIS — Z13220 Encounter for screening for lipoid disorders: Secondary | ICD-10-CM

## 2023-09-01 DIAGNOSIS — R5383 Other fatigue: Secondary | ICD-10-CM

## 2023-09-01 DIAGNOSIS — E559 Vitamin D deficiency, unspecified: Secondary | ICD-10-CM

## 2023-09-01 DIAGNOSIS — E032 Hypothyroidism due to medicaments and other exogenous substances: Secondary | ICD-10-CM

## 2023-09-01 DIAGNOSIS — Z131 Encounter for screening for diabetes mellitus: Secondary | ICD-10-CM

## 2023-09-01 NOTE — Telephone Encounter (Signed)
-----   Message from Gerry Krone sent at 09/01/2023  3:45 PM EDT ----- Regarding: Lab orders for , Tu 6.1.25 Patient is scheduled for CPX labs, please order future labs, Thanks , Anselmo Kings

## 2023-09-14 ENCOUNTER — Other Ambulatory Visit: Payer: Self-pay | Admitting: Family Medicine

## 2023-09-14 NOTE — Telephone Encounter (Signed)
 Last office visit 11/30/2022 cloudy urine, insect bite.  Last refilled 08/16/2023 for #30 with no refills.  Next Appt: CPE 09/27/23.

## 2023-09-20 ENCOUNTER — Other Ambulatory Visit (INDEPENDENT_AMBULATORY_CARE_PROVIDER_SITE_OTHER)

## 2023-09-20 ENCOUNTER — Ambulatory Visit: Payer: Self-pay | Admitting: Family Medicine

## 2023-09-20 DIAGNOSIS — E065 Other chronic thyroiditis: Secondary | ICD-10-CM

## 2023-09-20 DIAGNOSIS — E032 Hypothyroidism due to medicaments and other exogenous substances: Secondary | ICD-10-CM | POA: Diagnosis not present

## 2023-09-20 DIAGNOSIS — E038 Other specified hypothyroidism: Secondary | ICD-10-CM | POA: Diagnosis not present

## 2023-09-20 DIAGNOSIS — D508 Other iron deficiency anemias: Secondary | ICD-10-CM | POA: Diagnosis not present

## 2023-09-20 DIAGNOSIS — R5383 Other fatigue: Secondary | ICD-10-CM

## 2023-09-20 DIAGNOSIS — Z13220 Encounter for screening for lipoid disorders: Secondary | ICD-10-CM | POA: Diagnosis not present

## 2023-09-20 DIAGNOSIS — E538 Deficiency of other specified B group vitamins: Secondary | ICD-10-CM | POA: Diagnosis not present

## 2023-09-20 DIAGNOSIS — Z1159 Encounter for screening for other viral diseases: Secondary | ICD-10-CM

## 2023-09-20 DIAGNOSIS — Z131 Encounter for screening for diabetes mellitus: Secondary | ICD-10-CM | POA: Diagnosis not present

## 2023-09-20 DIAGNOSIS — E559 Vitamin D deficiency, unspecified: Secondary | ICD-10-CM | POA: Diagnosis not present

## 2023-09-20 LAB — COMPREHENSIVE METABOLIC PANEL WITH GFR
ALT: 15 U/L (ref 0–35)
AST: 18 U/L (ref 0–37)
Albumin: 4.1 g/dL (ref 3.5–5.2)
Alkaline Phosphatase: 72 U/L (ref 39–117)
BUN: 17 mg/dL (ref 6–23)
CO2: 33 meq/L — ABNORMAL HIGH (ref 19–32)
Calcium: 9.6 mg/dL (ref 8.4–10.5)
Chloride: 104 meq/L (ref 96–112)
Creatinine, Ser: 0.63 mg/dL (ref 0.40–1.20)
GFR: 87.02 mL/min (ref >60.00)
Glucose, Bld: 106 mg/dL — ABNORMAL HIGH (ref 70–99)
Potassium: 4.4 meq/L (ref 3.5–5.1)
Sodium: 142 meq/L (ref 135–145)
Total Bilirubin: 1.3 mg/dL — ABNORMAL HIGH (ref 0.2–1.2)
Total Protein: 6.5 g/dL (ref 6.0–8.3)

## 2023-09-20 LAB — CBC WITH DIFFERENTIAL/PLATELET
Basophils Absolute: 0 10*3/uL (ref 0.0–0.1)
Basophils Relative: 0.7 % (ref 0.0–3.0)
Eosinophils Absolute: 0.2 10*3/uL (ref 0.0–0.7)
Eosinophils Relative: 5.2 % — ABNORMAL HIGH (ref 0.0–5.0)
HCT: 36.5 % (ref 36.0–46.0)
Hemoglobin: 12.1 g/dL (ref 12.0–15.0)
Lymphocytes Relative: 45.6 % (ref 12.0–46.0)
Lymphs Abs: 1.7 10*3/uL (ref 0.7–4.0)
MCHC: 33.1 g/dL (ref 30.0–36.0)
MCV: 87.5 fl (ref 78.0–100.0)
Monocytes Absolute: 0.2 10*3/uL (ref 0.1–1.0)
Monocytes Relative: 5.7 % (ref 3.0–12.0)
Neutro Abs: 1.6 10*3/uL (ref 1.4–7.7)
Neutrophils Relative %: 42.8 % — ABNORMAL LOW (ref 43.0–77.0)
Platelets: 247 10*3/uL (ref 150.0–400.0)
RBC: 4.17 Mil/uL (ref 3.87–5.11)
RDW: 13.4 % (ref 11.5–15.5)
WBC: 3.7 10*3/uL — ABNORMAL LOW (ref 4.0–10.5)

## 2023-09-20 LAB — LIPID PANEL
Cholesterol: 175 mg/dL (ref 0–200)
HDL: 73 mg/dL (ref >39.00)
LDL Cholesterol: 91 mg/dL (ref 0–99)
NonHDL: 102.09
Total CHOL/HDL Ratio: 2
Triglycerides: 56 mg/dL (ref 0.0–149.0)
VLDL: 11.2 mg/dL (ref 0.0–40.0)

## 2023-09-20 LAB — FERRITIN: Ferritin: 15.7 ng/mL (ref 10.0–291.0)

## 2023-09-20 LAB — VITAMIN B12: Vitamin B-12: 318 pg/mL (ref 211–911)

## 2023-09-20 LAB — IBC PANEL
Iron: 104 ug/dL (ref 42–145)
Saturation Ratios: 24 % (ref 20.0–50.0)
TIBC: 434 ug/dL (ref 250.0–450.0)
Transferrin: 310 mg/dL (ref 212.0–360.0)

## 2023-09-20 LAB — T3, FREE: T3, Free: 2.6 pg/mL (ref 2.3–4.2)

## 2023-09-20 LAB — HEMOGLOBIN A1C: Hgb A1c MFr Bld: 5.8 % (ref 4.6–6.5)

## 2023-09-20 LAB — T4, FREE: Free T4: 1.01 ng/dL (ref 0.60–1.60)

## 2023-09-20 LAB — TSH: TSH: 1.42 u[IU]/mL (ref 0.35–5.50)

## 2023-09-20 LAB — VITAMIN D 25 HYDROXY (VIT D DEFICIENCY, FRACTURES): VITD: 16.82 ng/mL — ABNORMAL LOW (ref 30.00–100.00)

## 2023-09-20 NOTE — Progress Notes (Signed)
 No critical labs need to be addressed urgently. We will discuss labs in detail at upcoming office visit.

## 2023-09-21 LAB — HEPATITIS C ANTIBODY: Hepatitis C Ab: NONREACTIVE

## 2023-09-27 ENCOUNTER — Encounter: Payer: Self-pay | Admitting: Family Medicine

## 2023-09-27 ENCOUNTER — Ambulatory Visit (INDEPENDENT_AMBULATORY_CARE_PROVIDER_SITE_OTHER): Admitting: Family Medicine

## 2023-09-27 VITALS — BP 110/72 | HR 82 | Temp 98.0°F | Ht 66.0 in | Wt 173.0 lb

## 2023-09-27 DIAGNOSIS — M5441 Lumbago with sciatica, right side: Secondary | ICD-10-CM | POA: Diagnosis not present

## 2023-09-27 DIAGNOSIS — R3989 Other symptoms and signs involving the genitourinary system: Secondary | ICD-10-CM | POA: Insufficient documentation

## 2023-09-27 DIAGNOSIS — E559 Vitamin D deficiency, unspecified: Secondary | ICD-10-CM

## 2023-09-27 DIAGNOSIS — M8580 Other specified disorders of bone density and structure, unspecified site: Secondary | ICD-10-CM | POA: Diagnosis not present

## 2023-09-27 DIAGNOSIS — G8929 Other chronic pain: Secondary | ICD-10-CM | POA: Diagnosis not present

## 2023-09-27 DIAGNOSIS — Z0000 Encounter for general adult medical examination without abnormal findings: Secondary | ICD-10-CM

## 2023-09-27 DIAGNOSIS — D509 Iron deficiency anemia, unspecified: Secondary | ICD-10-CM

## 2023-09-27 DIAGNOSIS — M5442 Lumbago with sciatica, left side: Secondary | ICD-10-CM

## 2023-09-27 DIAGNOSIS — E538 Deficiency of other specified B group vitamins: Secondary | ICD-10-CM

## 2023-09-27 DIAGNOSIS — I700 Atherosclerosis of aorta: Secondary | ICD-10-CM

## 2023-09-27 DIAGNOSIS — E038 Other specified hypothyroidism: Secondary | ICD-10-CM | POA: Diagnosis not present

## 2023-09-27 LAB — POC URINALSYSI DIPSTICK (AUTOMATED)
Bilirubin, UA: NEGATIVE
Blood, UA: NEGATIVE
Glucose, UA: NEGATIVE
Ketones, UA: NEGATIVE
Leukocytes, UA: NEGATIVE
Nitrite, UA: NEGATIVE
Protein, UA: NEGATIVE
Spec Grav, UA: <=1.005 — AB (ref 1.010–1.025)
Urobilinogen, UA: 0.2 U/dL
pH, UA: 6.5 (ref 5.0–8.0)

## 2023-09-27 MED ORDER — VITAMIN D (ERGOCALCIFEROL) 1.25 MG (50000 UNIT) PO CAPS: 50000.0000 [IU] | ORAL_CAPSULE | ORAL | 0 refills | Status: DC

## 2023-09-27 MED ORDER — CARETOUCH PEN NEEDLES 29G X 12MM MISC: 0 refills | Status: AC

## 2023-09-27 MED ORDER — CYANOCOBALAMIN 1000 MCG/ML IJ SOLN: 1000.0000 ug | INTRAMUSCULAR | 3 refills | Status: AC

## 2023-09-27 NOTE — Assessment & Plan Note (Signed)
 Low, plan restart supplementation.

## 2023-09-27 NOTE — Assessment & Plan Note (Signed)
 Resolved

## 2023-09-27 NOTE — Assessment & Plan Note (Addendum)
 Noted on MRI 2023  Statin recommended.. pt refused, despite risk.

## 2023-09-27 NOTE — Assessment & Plan Note (Signed)
Stable, chronic.  Continue current medication.   Levo 125 mcg daily 

## 2023-09-27 NOTE — Assessment & Plan Note (Addendum)
 Low, plan restart supplementation.

## 2023-09-27 NOTE — Assessment & Plan Note (Addendum)
 Noted on 2023 MRI.  Due for DEXA.

## 2023-09-27 NOTE — Patient Instructions (Addendum)
 Please call the location of your choice from the menu below to schedule your Mammogram and/or Bone Density appointment.    [facility]   [facility]                      Phone:  [phone] [address]                               [place], [place] [address]                                                             Services: Traditional and 3D Mammogram, Bone Density    If dizzy spells continuing after vitamin supplementation.. make appt for further evaluation.

## 2023-09-27 NOTE — Progress Notes (Signed)
 Patient ID: Jody White, female    DOB: 01-04-1951, 73 y.o.   MRN: [id number]  This visit was conducted in person.  BP 110/72   Pulse 82   Temp 98 F (36.7 C) (Temporal)   Ht 5' 6 (1.676 m)   Wt 173 lb (78.5 kg)   SpO2 96%   BMI 27.92 kg/m    CC:  Chief Complaint  Patient presents with   Medicare Wellness   Referral    To Neurosurgeon Dr. Lonni Dibble-Patient already is scheduled.  Just need printed Referral to take with her to appointment.     Subjective:   HPI: Jody White is a 73 y.o. female presenting on 09/27/2023 for Medicare Wellness and Referral (To Neurosurgeon Dr. Lonni Dibble-Patient already is scheduled.  Just need printed Referral to take with her to appointment. )  The patient presents for medicare wellness , complete physical and review of chronic health problems. She also has the following acute concerns today:  She has noted darker urine but no  other symptoms.  She has been having 6 weeks of pain in low back pain  HX of surgery.. She request referral placed .SABRA She has an appt neurosurgery Dr. Vonna  [phone] In the morning okay okay we will put that referral in so it it should get there if you do need it right now the NS without lightheadedness some of the labs we did might somewhat explain some of that symptoms  I have personally reviewed the Medicare Annual Wellness questionnaire and have noted 1. The patient's medical and social history 2. Their use of alcohol, tobacco or illicit drugs 3. Their current medications and supplements 4. The patient's functional ability including ADL's, fall risks, home safety risks and hearing or visual             impairment. 5. Diet and physical activities 6. Evidence for depression or mood disorders 7.         Updated provider list Cognitive evaluation was performed and recorded on pt medicare questionnaire form. The patients weight, height, BMI and visual acuity have been recorded in the  chart   I have made referrals, counseling and provided education to the patient based review of the above and I have provided the pt with a written personalized care plan for preventive services.   Documentation of this information was scanned into the electronic record under the media tab.   Advance directives and end of life planning reviewed in detail with patient and documented in EMR. Patient given handout on advance care directives if needed. HCPOA and living will updated if needed.  Hearing Screening  Method: Audiometry   500Hz  1000Hz  2000Hz  4000Hz   Right ear 20 20 20 20   Left ear 20 20 20 20   Vision Screening - Comments:: Wears Glasses-Eye Exam with Dr[facility] Portia at [facility] 06/2023   No falls in last 12 months.  Hypothyroid  levo 125 mcg Lab Results  Component Value Date   TSH 1.42 09/20/2023   B12 and vit D def: previously on supplements She intermittent lightheadedness.    Aortic atherosclerosis  Lab Results  Component Value Date   CHOL 175 09/20/2023   HDL 73.00 09/20/2023   LDLCALC 91 09/20/2023   TRIG 56.0 09/20/2023   CHOLHDL 2 09/20/2023  The 10-year ASCVD risk score (Arnett DK, et al., 2019) is: 12.1%   Values used to calculate the score:     Age: 19 years  Clincally relevant sex: Female     Is Non-Hispanic African American: No     Diabetic: No     Tobacco smoker: No     Systolic Blood Pressure: 110 mmHg     Is BP treated: No     HDL Cholesterol: 73 mg/dL     Total Cholesterol: 175 mg/dL    Relevant past medical, surgical, family and social history reviewed and updated as indicated. Interim medical history since our last visit reviewed. Allergies and medications reviewed and updated. Outpatient Medications Prior to Visit  Medication Sig Dispense Refill   acetaminophen  (TYLENOL ) 500 MG tablet Take 1 tablet (500 mg total) by mouth every 6 (six) hours as needed. 30 tablet 0   cyclobenzaprine  (FLEXERIL ) 10 MG tablet TOME DE MEDIA A UNA TABLETA  (5-10 MG TOTAL) POR VIA ORAL TODOS LOS DIAS AL ACOSTARSE 30 tablet 0   dicyclomine  (BENTYL ) 10 MG capsule TOME 1 CAPSULA (10 MG TOTAL) POR VIA ORAL CUATRO VECES AL DIA ANTES DE LAS COMIDAS AND AL ACOSTARSE 90 capsule 0   levothyroxine  (SYNTHROID ) 125 MCG tablet TOME 1 TABLETA POR VIA ORAL TODOS LOS DIAS 90 tablet 0   meloxicam  (MOBIC ) 7.5 MG tablet TOME 1 TABLETA POR VIA ORAL TODOS LOS DIAS 30 tablet 0   zolpidem  (AMBIEN ) 10 MG tablet TOME 1 TABLETA POR VIA ORAL TODOS LOS DIAS AL ACOSTARSE CUANDO SEA NECESARIO PARA DORMIR 30 tablet 2   betamethasone  dipropionate 0.05 % cream Apply topically 2 (two) times daily. 30 g 0   Cholecalciferol (VITAMIN D3) 1.25 MG (50000 UT) CAPS TAKE 1 TABLET BY MOUTH EVERY 7 DAYS. (Patient not taking: Reported on 09/27/2023) 12 capsule 0   cyanocobalamin  (VITAMIN B12) 1000 MCG/ML injection Inject 1 mL (1,000 mcg total) into the muscle every 30 (thirty) days. (Patient not taking: Reported on 09/27/2023) 3 mL 3   mupirocin  ointment (BACTROBAN ) 2 % Apply 1 Application topically 2 (two) times daily. 22 g 0   No facility-administered medications prior to visit.     Per HPI unless specifically indicated in ROS section below Review of Systems  Constitutional:  Negative for fatigue and fever.  HENT:  Negative for congestion.   Eyes:  Negative for pain.  Respiratory:  Negative for cough and shortness of breath.   Cardiovascular:  Negative for chest pain, palpitations and leg swelling.  Gastrointestinal:  Negative for abdominal pain.  Genitourinary:  Negative for dysuria and vaginal bleeding.  Musculoskeletal:  Positive for back pain.  Neurological:  Positive for syncope, light-headedness and headaches.  Psychiatric/Behavioral:  Negative for dysphoric mood.     Objective:  BP 110/72   Pulse 82   Temp 98 F (36.7 C) (Temporal)   Ht 5' 6 (1.676 m)   Wt 173 lb (78.5 kg)   SpO2 96%   BMI 27.92 kg/m   Wt Readings from Last 3 Encounters:  09/27/23 173 lb (78.5 kg)   11/30/22 174 lb 6 oz (79.1 kg)  10/19/22 173 lb 8 oz (78.7 kg)      Physical Exam Constitutional:      General: She is not in acute distress.    Appearance: Normal appearance. She is well-developed. She is not ill-appearing or toxic-appearing.  HENT:     Head: Normocephalic.     Right Ear: Hearing, tympanic membrane, ear canal and external ear normal. Tympanic membrane is not erythematous, retracted or bulging.     Left Ear: Hearing, tympanic membrane, ear canal and external ear normal. Tympanic membrane is  not erythematous, retracted or bulging.     Nose: No mucosal edema or rhinorrhea.     Right Sinus: No maxillary sinus tenderness or frontal sinus tenderness.     Left Sinus: No maxillary sinus tenderness or frontal sinus tenderness.     Mouth/Throat:     Pharynx: Uvula midline.  Eyes:     General: Lids are normal. Lids are everted, no foreign bodies appreciated.     Conjunctiva/sclera: Conjunctivae normal.     Pupils: Pupils are equal, round, and reactive to light.  Neck:     Thyroid : No thyroid  mass or thyromegaly.     Vascular: No carotid bruit.     Trachea: Trachea normal.  Cardiovascular:     Rate and Rhythm: Normal rate and regular rhythm.     Pulses: Normal pulses.     Heart sounds: Normal heart sounds, S1 normal and S2 normal. No murmur heard.    No friction rub. No gallop.  Pulmonary:     Effort: Pulmonary effort is normal. No tachypnea or respiratory distress.     Breath sounds: Normal breath sounds. No decreased breath sounds, wheezing, rhonchi or rales.  Abdominal:     General: Bowel sounds are normal.     Palpations: Abdomen is soft.     Tenderness: There is no abdominal tenderness.  Musculoskeletal:     Cervical back: Normal range of motion and neck supple.  Skin:    General: Skin is warm and dry.     Findings: No rash.  Neurological:     Mental Status: She is alert.  Psychiatric:        Mood and Affect: Mood is not anxious or depressed.        Speech:  Speech normal.        Behavior: Behavior normal. Behavior is cooperative.        Thought Content: Thought content normal.        Judgment: Judgment normal.       Results for orders placed or performed in visit on 09/27/23  POCT Urinalysis Dipstick (Automated)   Collection Time: 09/27/23  2:48 PM  Result Value Ref Range   Color, UA Yellow    Clarity, UA Clear    Glucose, UA Negative Negative   Bilirubin, UA Negative    Ketones, UA Negative    Spec Grav, UA <=1.005 (A) 1.010 - 1.025   Blood, UA Negative    pH, UA 6.5 5.0 - 8.0   Protein, UA Negative Negative   Urobilinogen, UA 0.2 0.2 or 1.0 E.U./dL   Nitrite, UA Negative    Leukocytes, UA Negative Negative    Assessment and Plan The patient's preventative maintenance and recommended screening tests for an annual wellness exam were reviewed in full today. Brought up to date unless services declined.  Counselled on the importance of diet, exercise, and its role in overall health and mortality. The patient's FH and SH was reviewed, including their home life, tobacco status, and drug and alcohol status.   Vaccines:Consider shingrix, pneumovax Pap/DVE: not indicated Mammo:  due.. pt refuses Bone Density: due hx of osteopenia Colon: 2022 per pt, rec repeat 5 year. Smoking Status: none ETOH/ drug use: none/none  Hep C:  done      Medicare annual wellness visit, subsequent  Other specified hypothyroidism Assessment & Plan: Stable, chronic.  Continue current medication.  Levo  125 mcg daily   B12 deficiency Assessment & Plan: Low, plan restart supplementation.   Vitamin D  deficiency Assessment &  Plan: Low, plan restart supplementation.   Aortic atherosclerosis [facility]) Assessment & Plan: Noted on MRI 2023  Statin recommended.. pt refused, despite risk.    Iron deficiency anemia, unspecified iron deficiency anemia type Assessment & Plan: Resolved.   Osteopenia, unspecified location Assessment &  Plan: Noted on 2023 MRI.  Due for DEXA.  Orders: -     DG Bone Density; Future  Abnormal urine color -     POCT Urinalysis Dipstick (Automated)  Chronic bilateral low back pain with bilateral sciatica -     Ambulatory referral to Neurosurgery  Other orders -     Vitamin D  (Ergocalciferol ); Take 1 capsule (50,000 Units total) by mouth every 7 (seven) days.  Dispense: 12 capsule; Refill: 0 -     Cyanocobalamin ; Inject 1 mL (1,000 mcg total) into the muscle every 30 (thirty) days.  Dispense: 3 mL; Refill: 3 -     CareTouch Pen Needles; Use monthly with B12,  Dispense: 30 each; Refill: 0     Return in about 1 year (around 09/26/2024) for phone AMW,  fasting labs then CPE with me.   Greig Ring, MD

## 2023-09-28 ENCOUNTER — Other Ambulatory Visit: Payer: Self-pay | Admitting: Family Medicine

## 2023-09-28 DIAGNOSIS — E538 Deficiency of other specified B group vitamins: Secondary | ICD-10-CM

## 2023-09-28 MED ORDER — SYRINGE 25G X 1" 3 ML MISC: 0 refills | Status: AC

## 2023-09-29 DIAGNOSIS — M4316 Spondylolisthesis, lumbar region: Secondary | ICD-10-CM | POA: Diagnosis not present

## 2023-09-29 DIAGNOSIS — G959 Disease of spinal cord, unspecified: Secondary | ICD-10-CM | POA: Diagnosis not present

## 2023-09-29 DIAGNOSIS — M5416 Radiculopathy, lumbar region: Secondary | ICD-10-CM | POA: Diagnosis not present

## 2023-09-30 ENCOUNTER — Telehealth: Payer: Self-pay | Admitting: Family Medicine

## 2023-09-30 NOTE — Telephone Encounter (Signed)
 Error

## 2023-10-06 DIAGNOSIS — M5416 Radiculopathy, lumbar region: Secondary | ICD-10-CM | POA: Diagnosis not present

## 2023-10-06 DIAGNOSIS — M4316 Spondylolisthesis, lumbar region: Secondary | ICD-10-CM | POA: Diagnosis not present

## 2023-10-06 DIAGNOSIS — M5011 Cervical disc disorder with radiculopathy,  high cervical region: Secondary | ICD-10-CM | POA: Diagnosis not present

## 2023-10-06 DIAGNOSIS — M4716 Other spondylosis with myelopathy, lumbar region: Secondary | ICD-10-CM | POA: Diagnosis not present

## 2023-10-06 DIAGNOSIS — M4312 Spondylolisthesis, cervical region: Secondary | ICD-10-CM | POA: Diagnosis not present

## 2023-10-06 DIAGNOSIS — M5116 Intervertebral disc disorders with radiculopathy, lumbar region: Secondary | ICD-10-CM | POA: Diagnosis not present

## 2023-10-06 DIAGNOSIS — G959 Disease of spinal cord, unspecified: Secondary | ICD-10-CM | POA: Diagnosis not present

## 2023-10-06 DIAGNOSIS — M4802 Spinal stenosis, cervical region: Secondary | ICD-10-CM | POA: Diagnosis not present

## 2023-10-06 DIAGNOSIS — M5001 Cervical disc disorder with myelopathy,  high cervical region: Secondary | ICD-10-CM | POA: Diagnosis not present

## 2023-10-06 DIAGNOSIS — M4726 Other spondylosis with radiculopathy, lumbar region: Secondary | ICD-10-CM | POA: Diagnosis not present

## 2023-10-10 ENCOUNTER — Ambulatory Visit
Admission: EM | Admit: 2023-10-10 | Discharge: 2023-10-10 | Disposition: A | Discharge status: Home / Self Care | Attending: Physician Assistant | Admitting: Physician Assistant

## 2023-10-10 ENCOUNTER — Other Ambulatory Visit: Payer: Self-pay | Admitting: Family Medicine

## 2023-10-10 DIAGNOSIS — L0231 Cutaneous abscess of buttock: Secondary | ICD-10-CM | POA: Diagnosis not present

## 2023-10-10 MED ORDER — DOXYCYCLINE HYCLATE 100 MG PO CAPS: 100.0000 mg | ORAL_CAPSULE | Freq: Two times a day (BID) | ORAL | 0 refills | Status: AC

## 2023-10-10 MED ORDER — MUPIROCIN CALCIUM 2 % EX CREA: 1.0000 | TOPICAL_CREAM | Freq: Two times a day (BID) | CUTANEOUS | 0 refills | Status: AC

## 2023-10-10 NOTE — Telephone Encounter (Signed)
 Patient would like to have medication called in

## 2023-10-10 NOTE — Addendum Note (Signed)
 Addended by: WENDELL ARLAND RAMAN on: 10/10/2023 04:35 PM   Modules accepted: Orders

## 2023-10-10 NOTE — Telephone Encounter (Signed)
 Prescription Request  10/10/2023  LOV: 09/27/2023  What is the name of the medication or equipment? Betamethasone  and Mupirocin    Have you contacted your pharmacy to request a refill? Yes   Which pharmacy would you like this sent to?  [id number] [address], [place] - [address] [place] [address] Phone: [phone] Fax: [phone]   Patient notified that their request is being sent to the clinical staff for review and that they should receive a response within 2 business days.   Please advise at [facility] [phone]

## 2023-10-10 NOTE — Telephone Encounter (Signed)
 Last office visit 09/27/23 for CPE.  Last refilled Betamethasone  11/30/22 for 30 g with no refills.  Mupirocin  11/30/2022 for 22 g with no refills.  Next Appt: 10/18/2023 for Recurrent sores.

## 2023-10-10 NOTE — ED Provider Notes (Signed)
 Jody White    CSN: [id number] Arrival date & time: 10/10/23  1718      History   Chief Complaint Chief Complaint  Patient presents with   Abscess    HPI Jody White is a 73 y.o. female.   Patient has a abscess on her right buttock she has been taking an old prescription of Keflex and using Bactroban  ointment without relief  The history is provided by the patient. No language interpreter was used.  Abscess Location:  Pelvis Pelvic abscess location:  R buttock Size:  4 cm Abscess quality: fluctuance, painful, redness and warmth   Red streaking: no   Duration:  3 days Progression:  Worsening Pain details:    Severity:  Severe   Duration:  3 days   Timing:  Constant   Progression:  Worsening Relieved by:  Nothing Worsened by:  Nothing   Past Medical History:  Diagnosis Date   Anxiety    B12 deficiency 11/09/2011   Chronic fatigue syndrome 11/09/2011   H/O gastric bypass 11/09/2011   Hypothyroid 11/09/2011   Major depressive disorder, recurrent episode, severe, without mention of psychotic behavior 11/09/2011   Obesity    Panic disorder without agoraphobia 11/09/2011   Persistent disorder of initiating or maintaining sleep 11/09/2011   Vasovagal syncope 11/09/2011   Multiple prior admissions, [facility], [address] in [place] . Confirmed with [employer] testing in [place]    Vitamin D  deficiency 11/09/2011    Patient Active Problem List   Diagnosis Date Noted   Abnormal urine color 09/27/2023   Acute right-sided low back pain with right-sided sciatica 10/19/2022   Bilateral anterior knee pain 10/19/2022   Lumbar back pain with radiculopathy affecting right lower extremity 04/01/2022   Osteopenia 04/01/2022   Diverticulosis 04/01/2022   Aortic atherosclerosis (HCC) 04/01/2022   Hernia, inguinal, right 04/01/2022   History of recurrent UTI (urinary tract infection) 04/01/2022   Vasovagal syncope, recurrent with emotional stress 02/28/2015    Iron deficiency anemia 01/24/2012   B12 deficiency 11/09/2011   Vitamin D  deficiency 11/09/2011   Other fatigue 11/09/2011   H/O gastric bypass 11/09/2011   Hypothyroid 11/09/2011   Chronic insomnia 11/09/2011    Past Surgical History:  Procedure Laterality Date   BREAST SURGERY     CHOLECYSTECTOMY     GASTRIC BYPASS  03/22/2000   LAMINOTOMY / EXCISION DISK POSTERIOR CERVICAL SPINE     2023   TUBAL LIGATION      OB History   No obstetric history on file.      Home Medications    Prior to Admission medications   Medication Sig Start Date End Date Taking? Authorizing Provider  doxycycline  (VIBRAMYCIN ) 100 MG capsule Take 1 capsule (100 mg total) by mouth 2 (two) times daily. 10/10/23  Yes Kassadie Pancake K, PA-C  acetaminophen  (TYLENOL ) 500 MG tablet Take 1 tablet (500 mg total) by mouth every 6 (six) hours as needed. 08/24/17   Nivia Colon, PA-C  cyanocobalamin  (VITAMIN B12) 1000 MCG/ML injection Inject 1 mL (1,000 mcg total) into the muscle every 30 (thirty) days. 09/27/23   Bedsole, Amy E, MD  cyclobenzaprine  (FLEXERIL ) 10 MG tablet TOME DE MEDIA A UNA TABLETA (5-10 MG TOTAL) POR VIA ORAL TODOS LOS DIAS AL ACOSTARSE 09/14/23   Bedsole, Amy E, MD  dicyclomine  (BENTYL ) 10 MG capsule TOME 1 CAPSULA (10 MG TOTAL) POR VIA ORAL CUATRO VECES AL DIA ANTES DE LAS COMIDAS AND AL ACOSTARSE 02/07/23   Bedsole, Amy E,  MD  Insulin Pen Needle (CARETOUCH PEN NEEDLES) 29G X MISC Use monthly with B12, 09/27/23   Bedsole, Amy E, MD  levothyroxine  (SYNTHROID ) 125 MCG tablet TOME 1 TABLETA POR VIA ORAL TODOS LOS DIAS 10/10/23   Bedsole, Amy E, MD  meloxicam  (MOBIC ) 7.5 MG tablet TOME 1 TABLETA POR VIA ORAL TODOS LOS DIAS 12/30/22   Bedsole, Amy E, MD  Syringe/Needle, Disp, (SYRINGE 3CC/25GX1) 25G X 1 3 ML MISC Use to inject Vitamin B12 monthly 09/28/23   Bedsole, Amy E, MD  Vitamin D , Ergocalciferol , (DRISDOL ) 1.25 MG (50000 UNIT) CAPS capsule Take 1 capsule (50,000 Units total) by mouth every 7  (seven) days. 09/27/23   Bedsole, Amy E, MD  zolpidem  (AMBIEN ) 10 MG tablet TOME 1 TABLETA POR VIA ORAL TODOS LOS DIAS AL ACOSTARSE CUANDO SEA NECESARIO PARA DORMIR 07/19/23   Bedsole, Amy E, MD    Family History Family History  Problem Relation Age of Onset   Alcohol abuse Father     Social History Social History   Tobacco Use   Smoking status: Never   Smokeless tobacco: Never  Substance Use Topics   Alcohol use: Yes    Alcohol/week: 0.0 standard drinks of alcohol    Comment: wine occassionally   Drug use: No     Allergies   Latex   Review of Systems Review of Systems  All other systems reviewed and are negative.    Physical Exam Triage Vital Signs ED Triage Vitals  Encounter Vitals Group     BP 10/10/23 1818 (!) 142/85     Girls Systolic BP Percentile --      Girls Diastolic BP Percentile --      Boys Systolic BP Percentile --      Boys Diastolic BP Percentile --      Pulse Rate 10/10/23 1818 97     Resp 10/10/23 1818 18     Temp 10/10/23 1818 98.7 F (37.1 C)     Temp src --      SpO2 10/10/23 1818 100 %     Weight --      Height --      Head Circumference --      Peak Flow --      Pain Score 10/10/23 1827 10     Pain Loc --      Pain Education --      Exclude from Growth Chart --    No data found.  Updated Vital Signs BP (!) 142/85   Pulse 97   Temp 98.7 F (37.1 C)   Resp 18   SpO2 100%   Visual Acuity Right Eye Distance:   Left Eye Distance:   Bilateral Distance:    Right Eye Near:   Left Eye Near:    Bilateral Near:     Physical Exam Vitals reviewed.  Cardiovascular:     Rate and Rhythm: Normal rate.  Pulmonary:     Effort: Pulmonary effort is normal.  Musculoskeletal:     Comments: 4 cm swollen area right buttock  Skin:    General: Skin is warm.  Neurological:     General: No focal deficit present.     Mental Status: She is alert.      UC Treatments / Results  Labs (all labs ordered are listed, but only abnormal  results are displayed) Labs Reviewed - No data to display  EKG   Radiology No results found.  Procedures Incision and Drainage  Date/Time: 10/10/2023 7:03  PM  Performed by: Flint Sonny POUR, PA-C Authorized by: Flint Sonny POUR, PA-C   Consent:    Consent obtained:  Verbal   Consent given by:  Patient   Risks discussed:  Bleeding   Alternatives discussed:  No treatment Universal protocol:    Procedure explained and questions answered to patient or proxy's satisfaction: yes     Immediately prior to procedure, a time out was called: yes     Patient identity confirmed:  Verbally with patient Location:    Type:  Abscess   Size:  4   Location:  Lower extremity   Lower extremity location:  Buttock   Buttock location:  R buttock Pre-procedure details:    Skin preparation:  Povidone-iodine Sedation:    Sedation type:  None Anesthesia:    Anesthesia method:  Local infiltration   Local anesthetic:  Lidocaine  1% w/o epi Procedure type:    Complexity:  Simple Procedure details:    Incision types:  Single straight   Incision depth:  Subcutaneous   Wound management:  Probed and deloculated   Drainage:  Purulent   Drainage amount:  Moderate   Wound treatment:  Wound left open Post-procedure details:    Procedure completion:  Tolerated  (including critical care time)  Medications Ordered in UC Medications - No data to display  Initial Impression / Assessment and Plan / UC Course  I have reviewed the triage vital signs and the nursing notes.  Pertinent labs & imaging results that were available during my care of the patient were reviewed by me and considered in my medical decision making (see chart for details).      Final Clinical Impressions(s) / UC Diagnoses   Final diagnoses:  Abscess of buttock, right     Discharge Instructions      Return if any problems.  Soak area 20 minutes 4 times a day.      ED Prescriptions     Medication Sig Dispense Auth.  Provider   doxycycline  (VIBRAMYCIN ) 100 MG capsule Take 1 capsule (100 mg total) by mouth 2 (two) times daily. 20 capsule Layton Tappan K, PA-C   mupirocin  cream (BACTROBAN ) 2 % Apply 1 Application topically 2 (two) times daily. 15 g Danie Hannig K, PA-C      PDMP not reviewed this encounter. An After Visit Summary was printed and given to the patient.    Flint Sonny POUR, PA-C 10/10/23 1911

## 2023-10-10 NOTE — Discharge Instructions (Addendum)
Return if any problems.  Soak area 20 minutes 4 times a day °

## 2023-10-10 NOTE — ED Triage Notes (Signed)
 Patient to Urgent Care with complaints of a large abscess to left buttock.   Symptoms started on Friday. No drainage.   Started taking cephalexin (x3, 500mg  doses) and applying mupirocin  ointment from previous prescriptions.

## 2023-10-11 MED ORDER — MUPIROCIN 2 % EX OINT: 1.0000 | TOPICAL_OINTMENT | Freq: Two times a day (BID) | CUTANEOUS | 0 refills | Status: AC

## 2023-10-11 MED ORDER — BETAMETHASONE DIPROPIONATE 0.05 % EX CREA: TOPICAL_CREAM | Freq: Two times a day (BID) | CUTANEOUS | 0 refills | Status: AC

## 2023-10-13 DIAGNOSIS — L0231 Cutaneous abscess of buttock: Secondary | ICD-10-CM | POA: Diagnosis not present

## 2023-10-13 DIAGNOSIS — R42 Dizziness and giddiness: Secondary | ICD-10-CM | POA: Diagnosis not present

## 2023-10-13 DIAGNOSIS — R5383 Other fatigue: Secondary | ICD-10-CM | POA: Diagnosis not present

## 2023-10-17 DIAGNOSIS — M5451 Vertebrogenic low back pain: Secondary | ICD-10-CM | POA: Diagnosis not present

## 2023-10-17 DIAGNOSIS — M542 Cervicalgia: Secondary | ICD-10-CM | POA: Diagnosis not present

## 2023-10-18 ENCOUNTER — Ambulatory Visit: Payer: Self-pay | Admitting: Family Medicine

## 2023-10-18 ENCOUNTER — Ambulatory Visit (INDEPENDENT_AMBULATORY_CARE_PROVIDER_SITE_OTHER): Admitting: Family Medicine

## 2023-10-18 ENCOUNTER — Encounter: Payer: Self-pay | Admitting: Family Medicine

## 2023-10-18 VITALS — BP 108/60 | HR 76 | Temp 97.9°F | Ht 66.0 in | Wt 173.5 lb

## 2023-10-18 DIAGNOSIS — L0231 Cutaneous abscess of buttock: Secondary | ICD-10-CM | POA: Diagnosis not present

## 2023-10-18 DIAGNOSIS — R937 Abnormal findings on diagnostic imaging of other parts of musculoskeletal system: Secondary | ICD-10-CM

## 2023-10-18 DIAGNOSIS — L089 Local infection of the skin and subcutaneous tissue, unspecified: Secondary | ICD-10-CM | POA: Diagnosis not present

## 2023-10-18 LAB — CBC WITH DIFFERENTIAL/PLATELET
Basophils Absolute: 0 K/uL (ref 0.0–0.1)
Basophils Relative: 0.5 % (ref 0.0–3.0)
Eosinophils Absolute: 0.1 K/uL (ref 0.0–0.7)
Eosinophils Relative: 1.6 % (ref 0.0–5.0)
HCT: 37 % (ref 36.0–46.0)
Hemoglobin: 12.2 g/dL (ref 12.0–15.0)
Lymphocytes Relative: 28.5 % (ref 12.0–46.0)
Lymphs Abs: 1.5 K/uL (ref 0.7–4.0)
MCHC: 33.1 g/dL (ref 30.0–36.0)
MCV: 86.7 fl (ref 78.0–100.0)
Monocytes Absolute: 0.3 K/uL (ref 0.1–1.0)
Monocytes Relative: 6 % (ref 3.0–12.0)
Neutro Abs: 3.4 K/uL (ref 1.4–7.7)
Neutrophils Relative %: 63.4 % (ref 43.0–77.0)
Platelets: 367 K/uL (ref 150.0–400.0)
RBC: 4.26 Mil/uL (ref 3.87–5.11)
RDW: 13.4 % (ref 11.5–15.5)
WBC: 5.4 K/uL (ref 4.0–10.5)

## 2023-10-18 NOTE — Progress Notes (Signed)
 Patient ID: Jody White, female    DOB: 1950-09-01, 72 y.o.   MRN: [id number]  This visit was conducted in person.  BP 108/60   Pulse 76   Temp 97.9 F (36.6 C) (Temporal)   Ht 5' 6 (1.676 m)   Wt 173 lb 8 oz (78.7 kg)   SpO2 96%   BMI 28.00 kg/m    CC:  Chief Complaint  Patient presents with   Abscess    Left Buttocks-[facility] at [facility] Culture and labs done Also went to [facility] on [address]    Subjective:   HPI: Jody White is a 73 y.o. female presenting on 10/18/2023 for Abscess (Left Buttocks-[facility] at [facility] Culture and labs done/Also went to [facility])   New onset skin lesion on left buttocks She was seen at [facility] health urgent care on 7/21 at which time she underwent right buttock I&D with moderate amount of purulent drainage. She was discharged on doxycycline  and mupirocin . Patient states she had been using these medications as prescribed as well as warm compresses, however she she was concerned over persistent hardness in the area of the abscess... so went to ED on 10/17/2023  Notes reviewed. Bedside ultrasound was performed with no obvious drainable fluid pocket  Recommended mupirocin  ointment in nares.  Today she reports area is draining yellowish, non bloody.  Using heating pad.  No fever.  No flu like symptoms.  She has noted sore/boils  on abdomen and now buttock in last 3 months.      Relevant past medical, surgical, family and social history reviewed and updated as indicated. Interim medical history since our last visit reviewed. Allergies and medications reviewed and updated. Outpatient Medications Prior to Visit  Medication Sig Dispense Refill   acetaminophen  (TYLENOL ) 500 MG tablet Take 1 tablet (500 mg total) by mouth every 6 (six) hours as needed. 30 tablet 0   betamethasone  dipropionate 0.05 % cream Apply topically 2 (two) times daily. 30 g 0   cyanocobalamin  (VITAMIN B12) 1000 MCG/ML injection Inject 1 mL (1,000 mcg  total) into the muscle every 30 (thirty) days. 3 mL 3   cyclobenzaprine  (FLEXERIL ) 10 MG tablet TOME DE MEDIA A UNA TABLETA (5-10 MG TOTAL) POR VIA ORAL TODOS LOS DIAS AL ACOSTARSE 30 tablet 0   dicyclomine  (BENTYL ) 10 MG capsule TOME 1 CAPSULA (10 MG TOTAL) POR VIA ORAL CUATRO VECES AL DIA ANTES DE LAS COMIDAS AND AL ACOSTARSE 90 capsule 0   doxycycline  (VIBRAMYCIN ) 100 MG capsule Take 1 capsule (100 mg total) by mouth 2 (two) times daily. 20 capsule 0   Insulin Pen Needle (CARETOUCH PEN NEEDLES) 29G X MISC Use monthly with B12, 30 each 0   levothyroxine  (SYNTHROID ) 125 MCG tablet TOME 1 TABLETA POR VIA ORAL TODOS LOS DIAS 90 tablet 3   meloxicam  (MOBIC ) 7.5 MG tablet TOME 1 TABLETA POR VIA ORAL TODOS LOS DIAS 30 tablet 0   mupirocin  cream (BACTROBAN ) 2 % Apply 1 Application topically 2 (two) times daily. 15 g 0   mupirocin  ointment (BACTROBAN ) 2 % Apply 1 Application topically 2 (two) times daily. 22 g 0   Syringe/Needle, Disp, (SYRINGE 3CC/25GX1) 25G X 1 3 ML MISC Use to inject Vitamin B12 monthly 30 each 0   Vitamin D , Ergocalciferol , (DRISDOL ) 1.25 MG (50000 UNIT) CAPS capsule Take 1 capsule (50,000 Units total) by mouth every 7 (seven) days. 12 capsule 0   zolpidem  (AMBIEN ) 10 MG tablet TOME  1 TABLETA POR VIA ORAL TODOS LOS DIAS AL ACOSTARSE CUANDO SEA NECESARIO PARA DORMIR 30 tablet 2   No facility-administered medications prior to visit.     Per HPI unless specifically indicated in ROS section below Review of Systems  Constitutional:  Negative for fatigue and fever.  HENT:  Negative for congestion.   Eyes:  Negative for pain.  Respiratory:  Negative for cough and shortness of breath.   Cardiovascular:  Negative for chest pain, palpitations and leg swelling.  Gastrointestinal:  Negative for abdominal pain.  Genitourinary:  Negative for dysuria and vaginal bleeding.  Musculoskeletal:  Negative for back pain.  Neurological:  Negative for syncope, light-headedness and headaches.   Psychiatric/Behavioral:  Negative for dysphoric mood.    Objective:  BP 108/60   Pulse 76   Temp 97.9 F (36.6 C) (Temporal)   Ht 5' 6 (1.676 m)   Wt 173 lb 8 oz (78.7 kg)   SpO2 96%   BMI 28.00 kg/m   Wt Readings from Last 3 Encounters:  10/18/23 173 lb 8 oz (78.7 kg)  09/27/23 173 lb (78.5 kg)  11/30/22 174 lb 6 oz (79.1 kg)      Physical Exam Constitutional:      General: She is not in acute distress.    Appearance: Normal appearance. She is well-developed. She is not ill-appearing or toxic-appearing.  HENT:     Head: Normocephalic.     Right Ear: Hearing, tympanic membrane, ear canal and external ear normal. Tympanic membrane is not erythematous, retracted or bulging.     Left Ear: Hearing, tympanic membrane, ear canal and external ear normal. Tympanic membrane is not erythematous, retracted or bulging.     Nose: No mucosal edema or rhinorrhea.     Right Sinus: No maxillary sinus tenderness or frontal sinus tenderness.     Left Sinus: No maxillary sinus tenderness or frontal sinus tenderness.     Mouth/Throat:     Pharynx: Uvula midline.  Eyes:     General: Lids are normal. Lids are everted, no foreign bodies appreciated.     Conjunctiva/sclera: Conjunctivae normal.     Pupils: Pupils are equal, round, and reactive to light.  Neck:     Thyroid : No thyroid  mass or thyromegaly.     Vascular: No carotid bruit.     Trachea: Trachea normal.  Cardiovascular:     Rate and Rhythm: Normal rate and regular rhythm.     Pulses: Normal pulses.     Heart sounds: Normal heart sounds, S1 normal and S2 normal. No murmur heard.    No friction rub. No gallop.  Pulmonary:     Effort: Pulmonary effort is normal. No tachypnea or respiratory distress.     Breath sounds: Normal breath sounds. No decreased breath sounds, wheezing, rhonchi or rales.  Abdominal:     General: Bowel sounds are normal.     Palpations: Abdomen is soft.     Tenderness: There is no abdominal tenderness.   Musculoskeletal:     Cervical back: Normal range of motion and neck supple.  Skin:    General: Skin is warm and dry.     Findings: No rash.         Comments: 3 cm ulcer left buttock, with granualtion tissue at edges, slough centrally.  Neurological:     Mental Status: She is alert.  Psychiatric:        Mood and Affect: Mood is not anxious or depressed.  Speech: Speech normal.        Behavior: Behavior normal. Behavior is cooperative.        Thought Content: Thought content normal.        Judgment: Judgment normal.       Results for orders placed or performed in visit on 10/18/23  CBC with Differential/Platelet   Collection Time: 10/18/23 12:56 PM  Result Value Ref Range   WBC 5.4 4.0 - 10.5 K/uL   RBC 4.26 3.87 - 5.11 Mil/uL   Hemoglobin 12.2 12.0 - 15.0 g/dL   HCT 62.9 63.9 - 53.9 %   MCV 86.7 78.0 - 100.0 fl   MCHC 33.1 30.0 - 36.0 g/dL   RDW 86.5 88.4 - 84.4 %   Platelets 367.0 150.0 - 400.0 K/uL   Neutrophils Relative % 63.4 43.0 - 77.0 %   Lymphocytes Relative 28.5 12.0 - 46.0 %   Monocytes Relative 6.0 3.0 - 12.0 %   Eosinophils Relative 1.6 0.0 - 5.0 %   Basophils Relative 0.5 0.0 - 3.0 %   Neutro Abs 3.4 1.4 - 7.7 K/uL   Lymphs Abs 1.5 0.7 - 4.0 K/uL   Monocytes Absolute 0.3 0.1 - 1.0 K/uL   Eosinophils Absolute 0.1 0.0 - 0.7 K/uL   Basophils Absolute 0.0 0.0 - 0.1 K/uL    Assessment and Plan  Abnormal CT scan, cervical spine -     Culture, blood (single) w Reflex to ID Panel -     CBC with Differential/Platelet  Recurrent infection of skin -     Culture, blood (single) w Reflex to ID Panel -     CBC with Differential/Platelet  Abscess of buttock, left -     Culture, blood (single) w Reflex to ID Panel -     CBC with Differential/Platelet  Current left buttock abscess improving significantly after incision and drainage on doxycycline  and topical mupirocin  cream.  Recommended starting warm moist compresses 2-3 times a day. Continue daily  bandaging with absorbable dressing.  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  She has noted an increase in frequency and sores on her abdomen and this current buttock abscess within the last few months.  No culture was obtained at incision and drainage unfortunately. We discussed that currently while she is using nasal mupirocin  for decolonization as well as oral doxycycline  wound culture and nasal culture will likely be negative.  She requested blood culture given recurrent infections and her concern that there could be an infection in her spine.  She did have an abnormal CT scan reviewed in the office today with some possible concerns for possible infection but this has not yet been reviewed by the neurosurgeon.  She will follow-up with the neurosurgeon and discussed with them any possible concerns of discitis.  We will check a blood culture and CBC today in office.  No follow-ups on file.   Greig Ring, MD

## 2023-10-21 ENCOUNTER — Other Ambulatory Visit: Payer: Self-pay | Admitting: Family Medicine

## 2023-10-21 DIAGNOSIS — M542 Cervicalgia: Secondary | ICD-10-CM | POA: Diagnosis not present

## 2023-10-21 DIAGNOSIS — M5451 Vertebrogenic low back pain: Secondary | ICD-10-CM | POA: Diagnosis not present

## 2023-10-21 NOTE — Telephone Encounter (Signed)
 Last office visit 10/18/23 for abscess.  Last refilled 07/19/23 for #30 with 2 refills.  Next Appt: No future appointments.

## 2023-10-24 DIAGNOSIS — M542 Cervicalgia: Secondary | ICD-10-CM | POA: Diagnosis not present

## 2023-10-24 DIAGNOSIS — M5451 Vertebrogenic low back pain: Secondary | ICD-10-CM | POA: Diagnosis not present

## 2023-10-24 LAB — CULTURE, BLOOD (SINGLE)
MICRO NUMBER:: 16759440
Result:: NO GROWTH
SPECIMEN QUALITY:: ADEQUATE

## 2023-10-26 DIAGNOSIS — M542 Cervicalgia: Secondary | ICD-10-CM | POA: Diagnosis not present

## 2023-10-26 DIAGNOSIS — M5451 Vertebrogenic low back pain: Secondary | ICD-10-CM | POA: Diagnosis not present

## 2023-10-31 DIAGNOSIS — M542 Cervicalgia: Secondary | ICD-10-CM | POA: Diagnosis not present

## 2023-10-31 DIAGNOSIS — M5451 Vertebrogenic low back pain: Secondary | ICD-10-CM | POA: Diagnosis not present

## 2023-11-02 DIAGNOSIS — M5451 Vertebrogenic low back pain: Secondary | ICD-10-CM | POA: Diagnosis not present

## 2023-11-02 DIAGNOSIS — M542 Cervicalgia: Secondary | ICD-10-CM | POA: Diagnosis not present

## 2023-11-03 ENCOUNTER — Other Ambulatory Visit: Payer: Self-pay | Admitting: Family Medicine

## 2023-11-03 NOTE — Telephone Encounter (Signed)
 Last office visit 10/18/23 for Abscess and Abnormal CT scan, cervical spine.  Last refilled 12/30/22 for #30 with no refills.  Next appt: No future appointments.

## 2023-11-07 DIAGNOSIS — M5451 Vertebrogenic low back pain: Secondary | ICD-10-CM | POA: Diagnosis not present

## 2023-11-07 DIAGNOSIS — M542 Cervicalgia: Secondary | ICD-10-CM | POA: Diagnosis not present

## 2023-11-09 DIAGNOSIS — M5451 Vertebrogenic low back pain: Secondary | ICD-10-CM | POA: Diagnosis not present

## 2023-11-09 DIAGNOSIS — M542 Cervicalgia: Secondary | ICD-10-CM | POA: Diagnosis not present

## 2023-11-14 DIAGNOSIS — M542 Cervicalgia: Secondary | ICD-10-CM | POA: Diagnosis not present

## 2023-11-14 DIAGNOSIS — M5451 Vertebrogenic low back pain: Secondary | ICD-10-CM | POA: Diagnosis not present

## 2023-11-16 DIAGNOSIS — M5451 Vertebrogenic low back pain: Secondary | ICD-10-CM | POA: Diagnosis not present

## 2023-11-16 DIAGNOSIS — M542 Cervicalgia: Secondary | ICD-10-CM | POA: Diagnosis not present

## 2023-11-21 ENCOUNTER — Other Ambulatory Visit: Payer: Self-pay | Admitting: Family Medicine

## 2023-11-21 DIAGNOSIS — M5451 Vertebrogenic low back pain: Secondary | ICD-10-CM | POA: Diagnosis not present

## 2023-11-21 DIAGNOSIS — M542 Cervicalgia: Secondary | ICD-10-CM | POA: Diagnosis not present

## 2023-11-22 NOTE — Telephone Encounter (Signed)
 Last office visit 10/18/23 for abscess.  Last refilled 09/27/23 for #12 with no refills.  Vit D level 09/20/23 low at 16.82 ng/ml.  Next appt: No future appointments.

## 2023-11-23 DIAGNOSIS — M5451 Vertebrogenic low back pain: Secondary | ICD-10-CM | POA: Diagnosis not present

## 2023-11-23 DIAGNOSIS — M542 Cervicalgia: Secondary | ICD-10-CM | POA: Diagnosis not present

## 2023-12-14 DIAGNOSIS — L03011 Cellulitis of right finger: Secondary | ICD-10-CM | POA: Diagnosis not present

## 2023-12-14 DIAGNOSIS — L820 Inflamed seborrheic keratosis: Secondary | ICD-10-CM | POA: Diagnosis not present

## 2023-12-14 DIAGNOSIS — L538 Other specified erythematous conditions: Secondary | ICD-10-CM | POA: Diagnosis not present

## 2023-12-14 DIAGNOSIS — Z789 Other specified health status: Secondary | ICD-10-CM | POA: Diagnosis not present

## 2023-12-14 DIAGNOSIS — L2989 Other pruritus: Secondary | ICD-10-CM | POA: Diagnosis not present

## 2023-12-19 ENCOUNTER — Ambulatory Visit: Payer: Self-pay

## 2023-12-19 NOTE — Telephone Encounter (Signed)
 FYI Only or Action Required?: FYI only for provider.  Patient was last seen in primary care on 10/18/2023 by Avelina Greig BRAVO, MD.  Called Nurse Triage reporting Stye.  Symptoms began several days ago.  Interventions attempted: OTC medications: Stye eye drops.  Symptoms are: unchanged.  Triage Disposition: See Physician Within 24 Hours  Patient/caregiver understands and will follow disposition?: Yes  **Appt. Scheduled for 9/30 with PCP.**       Copied from CRM #[id number]. Topic: Clinical - Red Word Triage >> Dec 19, 2023 12:13 PM Donna BRAVO wrote: Red Word that prompted transfer to Nurse Triage: patient calling asking for prescription for stye in left eye lid, swollen red/purple color, don't see white spot/head Reason for Disposition  [1] Eyelid is very swollen AND [2] no fever  Answer Assessment - Initial Assessment Questions 1. LOCATION: Which eye has the sty? Upper or lower eyelid?       2. SIZE: How big is it? (Note: standard pencil eraser is 6 mm)       3. EYELID: Is the eyelid swollen? If Yes, ask: How much?     Left eye eyelid is swollen  4. REDNESS: Has the redness spread onto the eyelid?     Yes  5. ONSET: When did you notice the sty?     Last Friday  6. VISION: Do you have blurred vision?      No  7. PAIN: Is it painful? If Yes, ask: How bad is the pain?  (Scale 1-10; or mild, moderate, severe)     Mild   8. CONTACTS: Do you wear contacts?     No   9. OTHER SYMPTOMS: Do you have any other symptoms? (e.g., fever) No   Using OTC eye drops, symptoms remain. Appt. Scheduled for 9/30 with PCP.  Protocols used: Sty-A-AH

## 2023-12-20 ENCOUNTER — Ambulatory Visit (INDEPENDENT_AMBULATORY_CARE_PROVIDER_SITE_OTHER): Admitting: Family Medicine

## 2023-12-20 ENCOUNTER — Encounter: Payer: Self-pay | Admitting: Family Medicine

## 2023-12-20 VITALS — BP 100/60 | HR 69 | Temp 98.4°F | Ht 66.0 in | Wt 177.5 lb

## 2023-12-20 DIAGNOSIS — H00016 Hordeolum externum left eye, unspecified eyelid: Secondary | ICD-10-CM | POA: Diagnosis not present

## 2023-12-20 MED ORDER — ERYTHROMYCIN 5 MG/GM OP OINT: 1.0000 | TOPICAL_OINTMENT | Freq: Every day | OPHTHALMIC | 0 refills | Status: AC

## 2023-12-20 NOTE — Progress Notes (Signed)
 Patient ID: Jody White, female    DOB: [birth year], 73 y.o.   MRN: [id number]  This visit was conducted in person.  BP 100/60   Pulse 69   Temp 98.4 F (36.9 C) (Temporal)   Ht 5' 6 (1.676 m)   Wt 177 lb 8 oz (80.5 kg)   SpO2 99%   BMI 28.65 kg/m    CC:  Chief Complaint  Patient presents with   Eye Issue    Left    Subjective:   HPI: Jody White is a 73 y.o. female presenting on 12/20/2023 for Eye Issue (Left)   She has noted redness and swelling on left upper eyelid ongpoing x 6 days.  Sore. Not painful.  Has had similar in past .. Used some prednisone, No eye itching, no discharge.       Relevant past medical, surgical, family and social history reviewed and updated as indicated. Interim medical history since our last visit reviewed. Allergies and medications reviewed and updated. Outpatient Medications Prior to Visit  Medication Sig Dispense Refill   acetaminophen  (TYLENOL ) 500 MG tablet Take 1 tablet (500 mg total) by mouth every 6 (six) hours as needed. 30 tablet 0   betamethasone  dipropionate 0.05 % cream Apply topically 2 (two) times daily. 30 g 0   clobetasol cream (TEMOVATE) 0.05 % Apply 1 Application topically 2 (two) times daily.     cyanocobalamin  (VITAMIN B12) 1000 MCG/ML injection Inject 1 mL (1,000 mcg total) into the muscle every 30 (thirty) days. 3 mL 3   cyclobenzaprine  (FLEXERIL ) 10 MG tablet TOME DE MEDIA A UNA TABLETA (5-10 MG TOTAL) POR VIA ORAL TODOS LOS DIAS AL ACOSTARSE 30 tablet 0   dicyclomine  (BENTYL ) 10 MG capsule TOME 1 CAPSULA (10 MG TOTAL) POR VIA ORAL CUATRO VECES AL DIA ANTES DE LAS COMIDAS AND AL ACOSTARSE 90 capsule 0   doxycycline  (VIBRAMYCIN ) 100 MG capsule Take 1 capsule (100 mg total) by mouth 2 (two) times daily. 20 capsule 0   Insulin Pen Needle (CARETOUCH PEN NEEDLES) 29G X MISC Use monthly with B12, 30 each 0   levothyroxine  (SYNTHROID ) 125 MCG tablet TOME 1 TABLETA POR VIA ORAL TODOS LOS DIAS 90 tablet 3    meloxicam  (MOBIC ) 7.5 MG tablet TAKE 1 TABLET BY MOUTH EVERY DAY 30 tablet 0   mupirocin  cream (BACTROBAN ) 2 % Apply 1 Application topically 2 (two) times daily. 15 g 0   mupirocin  ointment (BACTROBAN ) 2 % Apply 1 Application topically 2 (two) times daily. 22 g 0   Syringe/Needle, Disp, (SYRINGE 3CC/25GX1) 25G X 1 3 ML MISC Use to inject Vitamin B12 monthly 30 each 0   Vitamin D , Ergocalciferol , (DRISDOL ) 1.25 MG (50000 UNIT) CAPS capsule TAKE 1 CAPSULE (50,000 UNITS TOTAL) BY MOUTH EVERY 7 (SEVEN) DAYS 12 capsule 0   zolpidem  (AMBIEN ) 10 MG tablet TOME 1 TABLETA POR VIA ORAL TODOS LOS DIAS AL ACOSTARSE CUANDO SEA NECESARIO PARA DORMIR 30 tablet 2   No facility-administered medications prior to visit.     Per HPI unless specifically indicated in ROS section below Review of Systems  Constitutional:  Negative for fatigue and fever.  HENT:  Negative for congestion.   Eyes:  Positive for redness. Negative for pain.  Respiratory:  Negative for cough and shortness of breath.   Cardiovascular:  Negative for chest pain, palpitations and leg swelling.  Gastrointestinal:  Negative for abdominal pain.  Genitourinary:  Negative for dysuria and vaginal bleeding.  Musculoskeletal:  Negative for back pain.  Neurological:  Negative for syncope, light-headedness and headaches.  Psychiatric/Behavioral:  Negative for dysphoric mood.    Objective:  BP 100/60   Pulse 69   Temp 98.4 F (36.9 C) (Temporal)   Ht 5' 6 (1.676 m)   Wt 177 lb 8 oz (80.5 kg)   SpO2 99%   BMI 28.65 kg/m   Wt Readings from Last 3 Encounters:  12/20/23 177 lb 8 oz (80.5 kg)  10/18/23 173 lb 8 oz (78.7 kg)  09/27/23 173 lb (78.5 kg)      Physical Exam Constitutional:      General: She is not in acute distress.    Appearance: Normal appearance. She is well-developed. She is not ill-appearing or toxic-appearing.  HENT:     Head: Normocephalic.     Right Ear: Hearing, tympanic membrane, ear canal and external ear  normal. Tympanic membrane is not erythematous, retracted or bulging.     Left Ear: Hearing, tympanic membrane, ear canal and external ear normal. Tympanic membrane is not erythematous, retracted or bulging.     Nose: No mucosal edema or rhinorrhea.     Right Sinus: No maxillary sinus tenderness or frontal sinus tenderness.     Left Sinus: No maxillary sinus tenderness or frontal sinus tenderness.     Mouth/Throat:     Pharynx: Uvula midline.  Eyes:     General: Lids are normal. Lids are everted, no foreign bodies appreciated.        Left eye: Hordeolum present.    Extraocular Movements:     Right eye: Normal extraocular motion and no nystagmus.     Left eye: Normal extraocular motion and no nystagmus.     Conjunctiva/sclera: Conjunctivae normal.     Pupils: Pupils are equal, round, and reactive to light.   Neck:     Thyroid : No thyroid  mass or thyromegaly.     Vascular: No carotid bruit.     Trachea: Trachea normal.  Cardiovascular:     Rate and Rhythm: Normal rate and regular rhythm.     Pulses: Normal pulses.     Heart sounds: Normal heart sounds, S1 normal and S2 normal. No murmur heard.    No friction rub. No gallop.  Pulmonary:     Effort: Pulmonary effort is normal. No tachypnea or respiratory distress.     Breath sounds: Normal breath sounds. No decreased breath sounds, wheezing, rhonchi or rales.  Abdominal:     General: Bowel sounds are normal.     Palpations: Abdomen is soft.     Tenderness: There is no abdominal tenderness.  Musculoskeletal:     Cervical back: Normal range of motion and neck supple.  Skin:    General: Skin is warm and dry.     Findings: No rash.  Neurological:     Mental Status: She is alert.  Psychiatric:        Mood and Affect: Mood is not anxious or depressed.        Speech: Speech normal.        Behavior: Behavior normal. Behavior is cooperative.        Thought Content: Thought content normal.        Judgment: Judgment normal.        Results for orders placed or performed in visit on 10/18/23  Culture, blood (single) w Reflex to ID Panel   Collection Time: 10/18/23 12:56 PM   Specimen: Blood  Result Value Ref Range   MICRO NUMBER:  [id number]    SPECIMEN QUALITY: Adequate    Source BLOOD    STATUS: FINAL    Result: No growth after 5 days    COMMENT: Aerobic and anaerobic bottle received.   CBC with Differential/Platelet   Collection Time: 10/18/23 12:56 PM  Result Value Ref Range   WBC 5.4 4.0 - 10.5 K/uL   RBC 4.26 3.87 - 5.11 Mil/uL   Hemoglobin 12.2 12.0 - 15.0 g/dL   HCT 62.9 63.9 - 53.9 %   MCV 86.7 78.0 - 100.0 fl   MCHC 33.1 30.0 - 36.0 g/dL   RDW 86.5 88.4 - 84.4 %   Platelets 367.0 150.0 - 400.0 K/uL   Neutrophils Relative % 63.4 43.0 - 77.0 %   Lymphocytes Relative 28.5 12.0 - 46.0 %   Monocytes Relative 6.0 3.0 - 12.0 %   Eosinophils Relative 1.6 0.0 - 5.0 %   Basophils Relative 0.5 0.0 - 3.0 %   Neutro Abs 3.4 1.4 - 7.7 K/uL   Lymphs Abs 1.5 0.7 - 4.0 K/uL   Monocytes Absolute 0.3 0.1 - 1.0 K/uL   Eosinophils Absolute 0.1 0.0 - 0.7 K/uL   Basophils Absolute 0.0 0.0 - 0.1 K/uL    Assessment and Plan  Hordeolum of left eye, unspecified eyelid, unspecified hordeolum type Assessment & Plan: Acute, recommended warm compresses 3-4 times a day, topical erythromycin ointment at bedtime. No clear sign of periocular cellulitis.  Good range of motion of eye and extraocular motion.  Return and ER precautions provided.   Other orders -     Erythromycin; Place 1 Application into the left eye at bedtime.  Dispense: 3.5 g; Refill: 0    No follow-ups on file.   Greig Ring, MD

## 2023-12-20 NOTE — Assessment & Plan Note (Signed)
 Acute, recommended warm compresses 3-4 times a day, topical erythromycin ointment at bedtime. No clear sign of periocular cellulitis.  Good range of motion of eye and extraocular motion.  Return and ER precautions provided.

## 2024-01-11 DIAGNOSIS — L03019 Cellulitis of unspecified finger: Secondary | ICD-10-CM | POA: Diagnosis not present

## 2024-01-11 DIAGNOSIS — L820 Inflamed seborrheic keratosis: Secondary | ICD-10-CM | POA: Diagnosis not present

## 2024-01-11 DIAGNOSIS — L538 Other specified erythematous conditions: Secondary | ICD-10-CM | POA: Diagnosis not present

## 2024-01-11 DIAGNOSIS — L2989 Other pruritus: Secondary | ICD-10-CM | POA: Diagnosis not present

## 2024-01-11 DIAGNOSIS — Z789 Other specified health status: Secondary | ICD-10-CM | POA: Diagnosis not present

## 2024-01-16 ENCOUNTER — Other Ambulatory Visit: Payer: Self-pay | Admitting: Family Medicine

## 2024-01-16 NOTE — Telephone Encounter (Signed)
 Last office visit 12/20/23 for hordeolum of left eye.  Last refilled 10/21/23 for #30 with 2 refills.  Next appt: No future appointments.

## 2024-02-22 NOTE — Progress Notes (Signed)
 Chief Complaint  Patient presents with   New Patient   Bloated   Nausea   Dysphagia   Gastroesophageal Reflux    Subjective  Jody White is a 73 y.o. female who presents for New Patient, Bloated, Nausea, Dysphagia, and Gastroesophageal Reflux HPI History of Present Illness Jody White is a 73 year old female with iron deficiency anemia and a history of gastric bypass who presents with abdominal pain and swallowing difficulties. She is accompanied by her husband, Pearson. She was referred by Dr. Daleen Puna for further evaluation of her gastrointestinal issues.  She has a history of iron deficiency anemia, initially diagnosed in [place] without specific investigation. Symptoms include nausea, vomiting, chest pressure, abdominal discomfort, fatigue, and weakness. She has not had a small bowel capsule endoscopy as part of her prior workup.  She underwent a gastric bypass in 2002 at [facility] , followed by corrective surgery for intestinal tangling. She experiences 'dumping syndrome' with sugar intake, leading to fainting episodes, the last of which occurred three years ago in [place]. She avoids sugar to prevent these symptoms.  She describes difficulty initiating swallowing, needing to consciously tell herself to swallow. Occasionally, she experiences coughing when swallowing liquids. She also reports chest pressure, and has a history of vasovagal episodes and anxiety.  She experiences abdominal pain, particularly after eating solid foods like meat and rice, described as pain that 'goes down' and extends to her back. This has been ongoing for several years, since her time in [place]. She finds soups easier to tolerate. She uses Tums and Pepto-Bismol for pain relief, but not for reflux.  She has a history of hiatal hernia, identified during previous evaluations, which she associates with bad taste and belching. She does not have a gallbladder, and she reports pain under  her sternum and on the sides under her breasts, which worsens with eating. The patient is a 73 year old female presenting for the above problems.  First of all, review of her previous records in [place]  from 06/13/2008 revealed colonic diverticulosis but was otherwise negative.  The patient also underwent EGD on 07/09/2020 in [place].  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  Colonoscopy was performed on 09/04/2020 in [place]  at [facility] revealing diverticulosis but and a possible small and polyp in the rectum. Patient comes in with a sheet of paper with multiple complaints written.  She reports several symptoms of stomach inflammation such as lower abdominal pain, bloating and nausea vomiting when eating food.  She also complains of chest pressure has as well as weird sounds of drops and dripping in the upper stomach after eating.  She reports other vague symptoms of fatigue weakness and sharp pain in the bones in the legs along with shortness of breath and white spots are cold sores in her mouth.  Patient does complain of sour bile taste in her mouth as well. She lists her current medications as levothyroxine , meloxicam , cyclobenzaprine , vitamin D2, dicyclomine  and a B complex vitamin. Patient reports she had an MRI in October 06, 2023 for cervical and lumbar pain at [facility] The impression was:  IMPRESSION:  1. Evidence of previous lumbar laminectomy from L2-L3 through L5-S1.  Capacious spinal canal. No spinal or lateral recess stenosis.  2. Superimposed multilevel mild spondylolisthesis at those levels,  most pronounced at L2-L3. No acute osseous abnormality. Associated  disc degeneration and facet arthropathy.  3. Superimposed left foraminal disc herniation at L2-L3 resulting in  moderate foraminal stenosis there. Query left L2 radiculitis. And  additional  moderate foraminal stenosis at the bilateral L3 and L4  nerve levels.    Electronically Signed    By: VEAR Hurst M.D.    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)   Hiatal hernia   Pharyngeal dysphagia   Diverticulosis of colon    Outpatient Medications Prior to Visit  Medication Sig Dispense Refill   cyclobenzaprine  (FLEXERIL ) 10 MG tablet Take 10 mg by mouth     ergocalciferol , vitamin D2, 1,250 mcg (50,000 unit) capsule Take 50,000 Units by mouth every 7 (seven) days     levothyroxine  (SYNTHROID ) 125 MCG tablet Take 125 mcg by mouth     meloxicam  (MOBIC ) 7.5 MG tablet Take 7.5 mg by mouth once daily     dicyclomine  (BENTYL ) 10 mg capsule TAKE 1 CAPSULE (10 MG TOTAL) BY MOUTH 4 TIMES A DAY BEFORE MEALS AND AT BEDTIME     No facility-administered medications prior to visit.      Objective  Vitals:   02/22/24 1444  BP: 107/58  Pulse: 81  Temp: 36.4 C (97.6 F)  TempSrc: Oral  Weight: 80.7 kg (178 lb)  Height: 167.6 cm (5' 6)  PainSc: 0-No pain   Body mass index is 28.73 kg/m.  Home Vitals:     Physical Exam Physical Exam HEENT: Pigmented spot on palate, tongue slightly white, oral cavity clear, no ulcerations. NECK: Neck supple, no swallowing problems. CHEST: Clear to auscultation bilaterally. ABDOMEN: Tenderness under sternum. Otherwise abdomen is soft,  without organomegaly.  No rebound tenderness bowel sounds positive  Constitutional: alert, in NAD, and communicates well Eye exam: pupils equal and reactive, extraocular eye movements intact. Neck: supple, no thyroid  enlargement or cervical adenopathy, and no bruits heard Respiratory: clear to auscultation, without rales or  wheezes  Cardiovascular: regular rate and rhythm and without murmurs, rubs or gallops Lower extremities: no lower extremity edema Skin ankles/feet: warm, good capillary refill and no ulcerations or lesions noted Neurological: sensorimotor grossly intact and normal muscle tone  Results DIAGNOSTIC Colonoscopy: Small rectal polyp, diverticulosis (08/2020)     Assessment/Plan:   Assessment & Plan Post-gastric bypass abdominal pain and dysphagia Chronic abdominal pain and dysphagia post-gastric bypass, exacerbated by solid foods. Pain is located in the upper abdomen and radiates to the back. Dysphagia involves difficulty initiating swallowing, with occasional coughing and regurgitation. Previous colonoscopy in 2022 showed diverticulosis and a small rectal polyp, but no significant findings to explain current symptoms. Differential includes anatomical changes post-surgery and potential small bowel issues. - Ordered upper GI small bowel follow-through to evaluate swallowing mechanism and intestinal anatomy post-surgery. - Will discuss results of the x-ray at follow-up appointment.  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 Symptoms are predictable and related to dietary choices. - Advised to avoid sugar intake to prevent dumping syndrome symptoms.  Iron deficiency anemia Chronic iron deficiency anemia since [place]. Previous investigations included endoscopy and colonoscopy, ruling out GI bleeding. Anemia may be nutritional or related to other causes. No current evidence of GI bleeding  or ulcerations. - Continue to monitor iron levels and symptoms.  Iron deficiency do to a large extent from gastric bypass.  Hiatal hernia Contributing to reflux symptoms, including bad taste and belching. Symptoms are managed with Tums and Pepsi. - Continue current management with Tums and Pepsi for symptom  relief.  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           Future Appointments   This patient does not currently have any appointments scheduled.     There are no Patient Instructions on file for this visit.  An after visit summary was provided for the patient either in written format (printed) or through [facility].  This note has been created using automated tools and reviewed for accuracy by TEODORO KEITH TOLEDO.

## 2024-03-06 ENCOUNTER — Other Ambulatory Visit: Payer: Self-pay | Admitting: Family Medicine

## 2024-03-06 ENCOUNTER — Other Ambulatory Visit: Payer: Self-pay | Admitting: Internal Medicine

## 2024-03-06 DIAGNOSIS — K909 Intestinal malabsorption, unspecified: Secondary | ICD-10-CM

## 2024-03-06 DIAGNOSIS — D509 Iron deficiency anemia, unspecified: Secondary | ICD-10-CM

## 2024-03-06 DIAGNOSIS — R1313 Dysphagia, pharyngeal phase: Secondary | ICD-10-CM

## 2024-03-06 DIAGNOSIS — Z9884 Bariatric surgery status: Secondary | ICD-10-CM

## 2024-03-06 NOTE — Telephone Encounter (Signed)
 Last office visit 12/20/2023 for eye issue..  Last refilled 11/22/2023 for #12 with no refills.  Vit D level 09/20/23 which was low at 16/82 ng/mL.  Next Appt: No future appointments.

## 2024-03-09 ENCOUNTER — Ambulatory Visit
Admission: RE | Admit: 2024-03-09 | Discharge: 2024-03-09 | Disposition: A | Discharge status: Home / Self Care | Source: Ambulatory Visit | Attending: Internal Medicine | Admitting: Internal Medicine

## 2024-03-09 DIAGNOSIS — K909 Intestinal malabsorption, unspecified: Secondary | ICD-10-CM | POA: Insufficient documentation

## 2024-03-09 DIAGNOSIS — R1313 Dysphagia, pharyngeal phase: Secondary | ICD-10-CM | POA: Insufficient documentation

## 2024-03-09 DIAGNOSIS — D509 Iron deficiency anemia, unspecified: Secondary | ICD-10-CM | POA: Insufficient documentation

## 2024-03-09 DIAGNOSIS — R197 Diarrhea, unspecified: Secondary | ICD-10-CM | POA: Insufficient documentation

## 2024-03-09 DIAGNOSIS — Z9884 Bariatric surgery status: Secondary | ICD-10-CM | POA: Insufficient documentation

## 2024-04-13 ENCOUNTER — Other Ambulatory Visit: Payer: Self-pay | Admitting: Family Medicine

## 2024-04-13 NOTE — Telephone Encounter (Signed)
 Last office visit 12/20/2023 for eye issue.  Last refilled 01/17/2024 for #30 with 2 refills. Next Appt: No future appointments.

## 2024-04-16 NOTE — Telephone Encounter (Signed)
 Copied from CRM #[id number]. Topic: Clinical - Medication Refill >> Apr 13, 2024  5:09 PM Alfonso ORN wrote: Medication:  cyclobenzaprine  (FLEXERIL ) 10 MG tablet  Has the patient contacted their pharmacy? Yes   This is the patient's preferred pharmacy:  [facility] [address], [place] - [address] [place] [address] Phone: [phone] Fax: [phone]    Is this the correct pharmacy for this prescription? Yes If no, delete pharmacy and type the correct one.   Has the prescription been filled recently? No, 02/04/2024  Is the patient out of the medication? No  Has the patient been seen for an appointment in the last year OR does the patient have an upcoming appointment? Yes  Can we respond through MyChart? No

## 2024-04-16 NOTE — Telephone Encounter (Signed)
 Last office visit 12/20/2023 for hordeolum of left eye.  Last refilled cyclobenzaprine  09/14/23 for #30 with no refills.  Dicyclomine  02/07/2023 for #90 with no refills.  Next Appt: No future appointments.
